# Patient Record
Sex: Female | Born: 1975 | Race: Black or African American | Hispanic: No | Smoking: Never smoker
Health system: Southern US, Community
[De-identification: ages and names within clinical notes are randomized; demographics above are authoritative.]

## PROBLEM LIST (undated history)

## (undated) DIAGNOSIS — F79 Unspecified intellectual disabilities: Secondary | ICD-10-CM

## (undated) DIAGNOSIS — E119 Type 2 diabetes mellitus without complications: Secondary | ICD-10-CM

## (undated) DIAGNOSIS — I10 Essential (primary) hypertension: Secondary | ICD-10-CM

## (undated) DIAGNOSIS — F259 Schizoaffective disorder, unspecified: Secondary | ICD-10-CM

## (undated) HISTORY — DX: Essential (primary) hypertension: I10

---

## 1998-02-16 ENCOUNTER — Encounter: Admission: RE | Admit: 1998-02-16 | Discharge: 1998-02-16 | Payer: Self-pay | Admitting: *Deleted

## 1998-02-16 ENCOUNTER — Encounter: Admission: RE | Admit: 1998-02-16 | Discharge: 1998-05-17 | Payer: Self-pay | Admitting: Internal Medicine

## 1999-05-28 ENCOUNTER — Encounter: Payer: Self-pay | Admitting: *Deleted

## 1999-05-28 ENCOUNTER — Inpatient Hospital Stay (HOSPITAL_COMMUNITY): Admission: AD | Admit: 1999-05-28 | Discharge: 1999-05-28 | Payer: Self-pay | Admitting: *Deleted

## 1999-06-09 ENCOUNTER — Emergency Department (HOSPITAL_COMMUNITY): Admission: EM | Admit: 1999-06-09 | Discharge: 1999-06-09 | Payer: Self-pay | Admitting: *Deleted

## 1999-07-17 ENCOUNTER — Inpatient Hospital Stay (HOSPITAL_COMMUNITY): Admission: AD | Admit: 1999-07-17 | Discharge: 1999-07-17 | Payer: Self-pay | Admitting: *Deleted

## 1999-08-26 ENCOUNTER — Encounter: Payer: Self-pay | Admitting: *Deleted

## 1999-08-26 ENCOUNTER — Ambulatory Visit (HOSPITAL_COMMUNITY): Admission: RE | Admit: 1999-08-26 | Discharge: 1999-08-26 | Payer: Self-pay | Admitting: *Deleted

## 1999-10-28 ENCOUNTER — Ambulatory Visit (HOSPITAL_COMMUNITY): Admission: RE | Admit: 1999-10-28 | Discharge: 1999-10-28 | Payer: Self-pay | Admitting: Obstetrics and Gynecology

## 1999-10-28 ENCOUNTER — Encounter: Payer: Self-pay | Admitting: Obstetrics and Gynecology

## 2000-01-19 ENCOUNTER — Inpatient Hospital Stay (HOSPITAL_COMMUNITY): Admission: AD | Admit: 2000-01-19 | Discharge: 2000-01-21 | Payer: Self-pay | Admitting: Obstetrics and Gynecology

## 2000-01-22 ENCOUNTER — Encounter: Admission: RE | Admit: 2000-01-22 | Discharge: 2000-02-14 | Payer: Self-pay | Admitting: Obstetrics and Gynecology

## 2000-04-15 ENCOUNTER — Other Ambulatory Visit: Admission: RE | Admit: 2000-04-15 | Discharge: 2000-04-15 | Payer: Self-pay | Admitting: Obstetrics and Gynecology

## 2000-04-15 ENCOUNTER — Encounter (INDEPENDENT_AMBULATORY_CARE_PROVIDER_SITE_OTHER): Payer: Self-pay

## 2000-09-24 ENCOUNTER — Other Ambulatory Visit: Admission: RE | Admit: 2000-09-24 | Discharge: 2000-09-24 | Payer: Self-pay | Admitting: Obstetrics and Gynecology

## 2001-03-17 ENCOUNTER — Emergency Department (HOSPITAL_COMMUNITY): Admission: EM | Admit: 2001-03-17 | Discharge: 2001-03-17 | Payer: Self-pay | Admitting: Emergency Medicine

## 2002-02-08 ENCOUNTER — Other Ambulatory Visit: Admission: RE | Admit: 2002-02-08 | Discharge: 2002-02-08 | Payer: Self-pay | Admitting: *Deleted

## 2002-09-05 ENCOUNTER — Inpatient Hospital Stay (HOSPITAL_COMMUNITY): Admission: AD | Admit: 2002-09-05 | Discharge: 2002-09-07 | Payer: Self-pay | Admitting: *Deleted

## 2002-10-18 ENCOUNTER — Other Ambulatory Visit: Admission: RE | Admit: 2002-10-18 | Discharge: 2002-10-18 | Payer: Self-pay | Admitting: *Deleted

## 2003-11-06 ENCOUNTER — Encounter: Admission: RE | Admit: 2003-11-06 | Discharge: 2003-11-06 | Payer: Self-pay | Admitting: Specialist

## 2005-12-26 ENCOUNTER — Other Ambulatory Visit: Admission: RE | Admit: 2005-12-26 | Discharge: 2005-12-26 | Payer: Self-pay | Admitting: Obstetrics and Gynecology

## 2006-11-22 ENCOUNTER — Emergency Department (HOSPITAL_COMMUNITY): Admission: EM | Admit: 2006-11-22 | Discharge: 2006-11-22 | Payer: Self-pay | Admitting: Emergency Medicine

## 2008-11-24 ENCOUNTER — Inpatient Hospital Stay (HOSPITAL_COMMUNITY): Admission: AD | Admit: 2008-11-24 | Discharge: 2008-11-28 | Payer: Self-pay | Admitting: Obstetrics & Gynecology

## 2009-06-09 ENCOUNTER — Inpatient Hospital Stay (HOSPITAL_COMMUNITY): Admission: AD | Admit: 2009-06-09 | Discharge: 2009-06-10 | Payer: Self-pay | Admitting: Obstetrics and Gynecology

## 2009-07-30 ENCOUNTER — Inpatient Hospital Stay (HOSPITAL_COMMUNITY): Admission: AD | Admit: 2009-07-30 | Discharge: 2009-07-30 | Payer: Self-pay | Admitting: Obstetrics & Gynecology

## 2009-08-06 ENCOUNTER — Inpatient Hospital Stay (HOSPITAL_COMMUNITY): Admission: AD | Admit: 2009-08-06 | Discharge: 2009-08-08 | Payer: Self-pay | Admitting: Obstetrics and Gynecology

## 2010-08-26 ENCOUNTER — Emergency Department (HOSPITAL_COMMUNITY)
Admission: EM | Admit: 2010-08-26 | Discharge: 2010-08-27 | Payer: Self-pay | Source: Home / Self Care | Admitting: Emergency Medicine

## 2010-08-27 ENCOUNTER — Inpatient Hospital Stay (HOSPITAL_COMMUNITY)
Admission: AD | Admit: 2010-08-27 | Discharge: 2010-09-02 | Payer: Self-pay | Source: Home / Self Care | Attending: Psychiatry | Admitting: Psychiatry

## 2010-08-27 DIAGNOSIS — F259 Schizoaffective disorder, unspecified: Secondary | ICD-10-CM

## 2010-08-27 LAB — DIFFERENTIAL
Basophils Absolute: 0 10*3/uL (ref 0.0–0.1)
Basophils Relative: 0 % (ref 0–1)
Eosinophils Absolute: 0.1 10*3/uL (ref 0.0–0.7)
Eosinophils Relative: 2 % (ref 0–5)
Lymphocytes Relative: 53 % — ABNORMAL HIGH (ref 12–46)
Lymphs Abs: 3.2 10*3/uL (ref 0.7–4.0)
Monocytes Absolute: 0.3 10*3/uL (ref 0.1–1.0)
Monocytes Relative: 5 % (ref 3–12)
Neutro Abs: 2.4 10*3/uL (ref 1.7–7.7)
Neutrophils Relative %: 40 % — ABNORMAL LOW (ref 43–77)

## 2010-08-27 LAB — URINALYSIS, ROUTINE W REFLEX MICROSCOPIC
Bilirubin Urine: NEGATIVE
Ketones, ur: NEGATIVE mg/dL
Leukocytes, UA: NEGATIVE
Nitrite: NEGATIVE
Specific Gravity, Urine: 1.005 (ref 1.005–1.030)
Urine Glucose, Fasting: NEGATIVE mg/dL
pH: 6.5 (ref 5.0–8.0)

## 2010-08-27 LAB — URINE MICROSCOPIC-ADD ON

## 2010-08-27 LAB — CBC
HCT: 38.5 % (ref 36.0–46.0)
Hemoglobin: 13.2 g/dL (ref 12.0–15.0)
MCH: 30.8 pg (ref 26.0–34.0)
MCHC: 34.3 g/dL (ref 30.0–36.0)
MCV: 89.7 fL (ref 78.0–100.0)
Platelets: 219 10*3/uL (ref 150–400)
RBC: 4.29 MIL/uL (ref 3.87–5.11)
RDW: 12.1 % (ref 11.5–15.5)
WBC: 6 10*3/uL (ref 4.0–10.5)

## 2010-08-27 LAB — BASIC METABOLIC PANEL
CO2: 25 mEq/L (ref 19–32)
Calcium: 8.9 mg/dL (ref 8.4–10.5)
Chloride: 107 mEq/L (ref 96–112)
Creatinine, Ser: 0.72 mg/dL (ref 0.4–1.2)
GFR calc non Af Amer: >60 mL/min (ref >60)
Potassium: 3.7 mEq/L (ref 3.5–5.1)

## 2010-08-27 LAB — RAPID URINE DRUG SCREEN, HOSP PERFORMED
Cocaine: NOT DETECTED
Opiates: NOT DETECTED

## 2010-08-27 LAB — POCT PREGNANCY, URINE: Preg Test, Ur: NEGATIVE

## 2010-08-27 LAB — ETHANOL: Alcohol, Ethyl (B): <5 mg/dL (ref 0–10)

## 2010-09-09 NOTE — H&P (Signed)
  NAMEELVIN, BANKER                  ACCOUNT NO.:  [id number]  MEDICAL RECORD NO.:  [id number]          PATIENT TYPE:  IPS  LOCATION:  0400                          FACILITY:  [facility]  PHYSICIAN:  Anselm Jungling, MD  DATE OF BIRTH:  06/29/1976  DATE OF ADMISSION:  08/27/2010 DATE OF DISCHARGE:                      PSYCHIATRIC ADMISSION ASSESSMENT   This is a 35 year old female, voluntarily admitted August 27, 2010. The patient presented to the emergency department with complaints of suicidal thoughts, brought in by her crisis counselor.  The patient has been hearing voices, telling her to say and do things that are harmful to herself and others.  She feels very stressed and anxious.  She states her children are currently in social service custody and she states that the voices were telling her to drive around with them for some period of time.  She has been experiencing increased agitation, dizziness, racing thoughts, muscle pain.  The patient denies any substance use.  PAST PSYCHIATRIC HISTORY:  First admission to the [facility].  Her psychiatric treatment is with [facility].  SOCIAL HISTORY:  The patient resides in [place].  She lives with her parents and a 35-year-old daughter, born in [place].  FAMILY HISTORY:  None.  ALCOHOL/DRUG HISTORY:  She denies any substance use.  PRIMARY CARE PROVIDER:  None.  MEDICAL PROBLEMS:  History of migraines.  MEDICATIONS:  None.  DRUG ALLERGIES:  No known allergies.  PHYSICAL EXAM:  This is a well-nourished, well-developed female that was assessed in the emergency department.  Her laboratory data shows a CBC within normal.  Serum pregnancy test is negative.  Alcohol level less than 5.  Her BMET is within normal limits.  Urine drug screen is negative.  MENTAL STATUS EXAM:  The patient is in bed.  She is complaining of anxiety, having some twitching to her neck.  The patient feels that it is from stress and anxiety.   Her speech is clear.  She has intermittent eye contact.  She is, however, overall pleasant and cooperative, acknowledges need for treatment.  AXIS I:  Psychosis, not otherwise specified.  Depressive disorder, not otherwise specified. AXIS II:  Deferred. AXIS III:  No known medical conditions. AXIS IV:  Problems with legal system and possible other psychosocial problems. AXIS V:  Current is 30.  PLAN:  Our plan is to initiate Risperdal twice a day for psychotic symptoms and anxiety.  We will have Ativan available on a p.r.n. basis. Continue to do reality testing.  Contact family for concerned support, maybe change her living situation.  Her tentative length stay at this time is 3-5 days.     Landry Corporal, N.P.   ______________________________ Anselm Jungling, MD    JO/MEDQ  D:  08/28/2010  T:  08/28/2010  Job:  [id number]  Electronically Signed by Limmie PatriciaP. on 09/02/2010 03:52:12 PM Electronically Signed by Geralyn Flash MD on 09/09/2010 10:52:30 AM

## 2010-09-09 NOTE — Discharge Summary (Signed)
Tracy Vaughn, KOHEN                  ACCOUNT NO.:  [id number]  MEDICAL RECORD NO.:  [id number]          PATIENT TYPE:  IPS  LOCATION:  0400                          FACILITY:  [facility]  PHYSICIAN:  Anselm Jungling, MD  DATE OF BIRTH:  [birth year]  DATE OF ADMISSION:  08/27/2010 DATE OF DISCHARGE:  09/02/2010                              DISCHARGE SUMMARY   IDENTIFYING DATA AND REASON FOR ADMISSION:  This was an inpatient psychiatric admission for Hadja, a 35 year old African woman, who was admitted because of depression and symptoms of psychosis.  Please refer to the admission note for further details pertaining to the symptoms, circumstances, and history that led to her hospitalization.  She was given an initial Axis I diagnosis of schizoaffective disorder, NOS.  MEDICAL AND LABORATORY:  The patient was medically and physically assessed by the psychiatric nurse practitioner.  She was in good health, without any active or chronic medical problems.  There were no significant medical issues during her hospital stay.  HOSPITAL COURSE:  The patient was admitted to the adult inpatient psychiatric service.  She presented as a tall, normally-developed, well- groomed and dressed African woman who was quite articulate in [place], even though English was not her original language.  She was a good historian.  Her insight was intact.  She was able to acknowledge her depression and auditory hallucinations.  She did not appear to have any overt delusional beliefs, but apparently, in a previous episode of illness in 2010, she had had both auditory hallucinations and perceptual experiences involving a sense of strong spiritualization.  She indicated that her fiance is currently living in [place].  She had some other family here.  She was open to medication treatment to address her symptoms and was placed on a trial of risperidone which was increased to 1 mg b.i.d. With this, the patient reported  good resolution of her auditory hallucinations over several days.  She participated in therapeutic groups and activities and was a good participant throughout.  She was generally pleasant and cooperative.  Although she was not depressed per se, she was sad at times, but generally appeared quite comfortable being in this treatment situation and getting help.  "I have to get help to feel better."  Later during her stay, low-dose Klonopin 0.25 mg b.i.d. or t.i.d. was added to her regimen to address some symptoms of  anxiety.  On the seventh hospital day, the patient indicated that she was feeling much better with respect to mood and absence of auditory hallucinations. She indicated that she felt ready for discharge.  She indicated a strong willingness to continue with medication in the outpatient treatment arena.  She agreed to the following aftercare plan:  AFTERCARE:  The patient was to follow up with the [facility], appointment to be arranged at the time of this dictation.  DISCHARGE MEDICATIONS:  Risperdal 1 mg b.i.d.  The patient was not discharged on Klonopin.  DISCHARGE DIAGNOSES:  AXIS I:  Schizoaffective disorder, not otherwise specified. AXIS II:  Deferred. AXIS III:  No acute or chronic illnesses. AXIS IV:  Stressors moderate.  AXIS V:  Global assessment of functioning on discharge 50.     Anselm Jungling, MD     SPB/MEDQ  D:  09/02/2010  T:  09/02/2010  Job:  [id number]  Electronically Signed by Geralyn Flash MD on 09/09/2010 10:52:27 AM

## 2010-10-20 LAB — CBC
HCT: 35.8 % — ABNORMAL LOW (ref 36.0–46.0)
HCT: 38.7 % (ref 36.0–46.0)
MCHC: 33.2 g/dL (ref 30.0–36.0)
MCV: 92.3 fL (ref 78.0–100.0)
Platelets: 190 10*3/uL (ref 150–400)
Platelets: 202 10*3/uL (ref 150–400)
RBC: 3.87 MIL/uL (ref 3.87–5.11)
RDW: 14.6 % (ref 11.5–15.5)
WBC: 10.1 10*3/uL (ref 4.0–10.5)
WBC: 15.6 10*3/uL — ABNORMAL HIGH (ref 4.0–10.5)

## 2010-10-20 LAB — RPR: RPR Ser Ql: NONREACTIVE

## 2010-10-20 LAB — SICKLE CELL SCREEN: Sickle Cell Screen: NEGATIVE

## 2010-11-06 LAB — URINALYSIS, ROUTINE W REFLEX MICROSCOPIC
Bilirubin Urine: NEGATIVE
Glucose, UA: NEGATIVE mg/dL
Ketones, ur: NEGATIVE mg/dL
Protein, ur: NEGATIVE mg/dL
pH: 5 (ref 5.0–8.0)

## 2010-11-06 LAB — WET PREP, GENITAL
Clue Cells Wet Prep HPF POC: NONE SEEN
Trich, Wet Prep: NONE SEEN

## 2010-11-06 LAB — STREP B DNA PROBE: Strep Group B Ag: NEGATIVE

## 2010-11-06 LAB — URINE MICROSCOPIC-ADD ON

## 2010-11-08 ENCOUNTER — Emergency Department (HOSPITAL_COMMUNITY)
Admission: EM | Admit: 2010-11-08 | Discharge: 2010-11-08 | Disposition: A | Discharge status: Home / Self Care | Payer: Medicaid Other | Attending: Emergency Medicine | Admitting: Emergency Medicine

## 2010-11-08 DIAGNOSIS — G2589 Other specified extrapyramidal and movement disorders: Secondary | ICD-10-CM | POA: Insufficient documentation

## 2010-11-08 DIAGNOSIS — I10 Essential (primary) hypertension: Secondary | ICD-10-CM | POA: Insufficient documentation

## 2010-11-08 DIAGNOSIS — F319 Bipolar disorder, unspecified: Secondary | ICD-10-CM | POA: Insufficient documentation

## 2010-11-13 LAB — COMPREHENSIVE METABOLIC PANEL
ALT: 23 U/L (ref 0–35)
ALT: 40 U/L — ABNORMAL HIGH (ref 0–35)
AST: 26 U/L (ref 0–37)
AST: 29 U/L (ref 0–37)
Albumin: 2 g/dL — ABNORMAL LOW (ref 3.5–5.2)
Albumin: 2.1 g/dL — ABNORMAL LOW (ref 3.5–5.2)
Alkaline Phosphatase: 65 U/L (ref 39–117)
Alkaline Phosphatase: 67 U/L (ref 39–117)
BUN: 3 mg/dL — ABNORMAL LOW (ref 6–23)
BUN: 5 mg/dL — ABNORMAL LOW (ref 6–23)
BUN: 5 mg/dL — ABNORMAL LOW (ref 6–23)
CO2: 24 mEq/L (ref 19–32)
CO2: 26 mEq/L (ref 19–32)
Calcium: 8.3 mg/dL — ABNORMAL LOW (ref 8.4–10.5)
Calcium: 8.4 mg/dL (ref 8.4–10.5)
Chloride: 104 mEq/L (ref 96–112)
Chloride: 106 mEq/L (ref 96–112)
Chloride: 107 mEq/L (ref 96–112)
Creatinine, Ser: 0.59 mg/dL (ref 0.4–1.2)
Creatinine, Ser: 0.6 mg/dL (ref 0.4–1.2)
Creatinine, Ser: 0.62 mg/dL (ref 0.4–1.2)
GFR calc Af Amer: >60 mL/min (ref >60)
GFR calc Af Amer: >60 mL/min (ref >60)
GFR calc non Af Amer: >60 mL/min (ref >60)
GFR calc non Af Amer: >60 mL/min (ref >60)
Glucose, Bld: 107 mg/dL — ABNORMAL HIGH (ref 70–99)
Glucose, Bld: 69 mg/dL — ABNORMAL LOW (ref 70–99)
Potassium: 3.8 mEq/L (ref 3.5–5.1)
Potassium: 3.8 mEq/L (ref 3.5–5.1)
Sodium: 128 mEq/L — ABNORMAL LOW (ref 135–145)
Sodium: 134 mEq/L — ABNORMAL LOW (ref 135–145)
Total Bilirubin: 0.6 mg/dL (ref 0.3–1.2)
Total Bilirubin: 0.7 mg/dL (ref 0.3–1.2)
Total Protein: 6.4 g/dL (ref 6.0–8.3)
Total Protein: 6.6 g/dL (ref 6.0–8.3)
Total Protein: 6.6 g/dL (ref 6.0–8.3)

## 2010-11-13 LAB — DIFFERENTIAL
Basophils Absolute: 0 10*3/uL (ref 0.0–0.1)
Basophils Absolute: 0 10*3/uL (ref 0.0–0.1)
Basophils Relative: 0 % (ref 0–1)
Eosinophils Absolute: 0 10*3/uL (ref 0.0–0.7)
Eosinophils Relative: 1 % (ref 0–5)
Eosinophils Relative: 1 % (ref 0–5)
Lymphocytes Relative: 33 % (ref 12–46)
Lymphocytes Relative: 36 % (ref 12–46)
Lymphocytes Relative: 41 % (ref 12–46)
Lymphs Abs: 0.6 10*3/uL — ABNORMAL LOW (ref 0.7–4.0)
Lymphs Abs: 1.3 10*3/uL (ref 0.7–4.0)
Lymphs Abs: 1.9 10*3/uL (ref 0.7–4.0)
Monocytes Absolute: 0.5 10*3/uL (ref 0.1–1.0)
Monocytes Absolute: 0.6 10*3/uL (ref 0.1–1.0)
Monocytes Absolute: 0.8 10*3/uL (ref 0.1–1.0)
Monocytes Relative: 17 % — ABNORMAL HIGH (ref 3–12)
Monocytes Relative: 18 % — ABNORMAL HIGH (ref 3–12)
Neutro Abs: 1.7 10*3/uL (ref 1.7–7.7)
Neutro Abs: 1.9 10*3/uL (ref 1.7–7.7)
Neutro Abs: 1.9 10*3/uL (ref 1.7–7.7)
Neutrophils Relative %: 80 % — ABNORMAL HIGH (ref 43–77)

## 2010-11-13 LAB — TYPE AND SCREEN: Antibody Screen: NEGATIVE

## 2010-11-13 LAB — CBC
HCT: 22.3 % — ABNORMAL LOW (ref 36.0–46.0)
HCT: 22.5 % — ABNORMAL LOW (ref 36.0–46.0)
HCT: 25 % — ABNORMAL LOW (ref 36.0–46.0)
HCT: 27.7 % — ABNORMAL LOW (ref 36.0–46.0)
HCT: 31.3 % — ABNORMAL LOW (ref 36.0–46.0)
Hemoglobin: 10.8 g/dL — ABNORMAL LOW (ref 12.0–15.0)
Hemoglobin: 7.9 g/dL — CL (ref 12.0–15.0)
MCHC: 34.6 g/dL (ref 30.0–36.0)
MCHC: 35.1 g/dL (ref 30.0–36.0)
MCV: 84.7 fL (ref 78.0–100.0)
MCV: 84.9 fL (ref 78.0–100.0)
MCV: 85 fL (ref 78.0–100.0)
Platelets: 129 10*3/uL — ABNORMAL LOW (ref 150–400)
Platelets: 75 10*3/uL — ABNORMAL LOW (ref 150–400)
Platelets: 78 10*3/uL — ABNORMAL LOW (ref 150–400)
RBC: 2.65 MIL/uL — ABNORMAL LOW (ref 3.87–5.11)
RBC: 2.66 MIL/uL — ABNORMAL LOW (ref 3.87–5.11)
RBC: 3.69 MIL/uL — ABNORMAL LOW (ref 3.87–5.11)
RDW: 15 % (ref 11.5–15.5)
RDW: 15.4 % (ref 11.5–15.5)
WBC: 3.6 10*3/uL — ABNORMAL LOW (ref 4.0–10.5)
WBC: 3.8 10*3/uL — ABNORMAL LOW (ref 4.0–10.5)
WBC: 4.7 10*3/uL (ref 4.0–10.5)
WBC: 5.4 10*3/uL (ref 4.0–10.5)

## 2010-11-13 LAB — CULTURE, BLOOD (ROUTINE X 2)

## 2010-11-13 LAB — URINE CULTURE: Colony Count: >=100000

## 2010-11-13 LAB — GLUCOSE, CAPILLARY
Glucose-Capillary: 119 mg/dL — ABNORMAL HIGH (ref 70–99)
Glucose-Capillary: 65 mg/dL — ABNORMAL LOW (ref 70–99)
Glucose-Capillary: 82 mg/dL (ref 70–99)
Glucose-Capillary: 86 mg/dL (ref 70–99)

## 2010-11-13 LAB — HIV ANTIBODY (ROUTINE TESTING W REFLEX): HIV: NONREACTIVE

## 2010-11-13 LAB — URINALYSIS, ROUTINE W REFLEX MICROSCOPIC
Nitrite: NEGATIVE
Specific Gravity, Urine: 1.015 (ref 1.005–1.030)
pH: 6 (ref 5.0–8.0)

## 2010-11-13 LAB — HCG, QUANTITATIVE, PREGNANCY: hCG, Beta Chain, Quant, S: 4135 m[IU]/mL — ABNORMAL HIGH (ref <5)

## 2010-11-13 LAB — URINE MICROSCOPIC-ADD ON

## 2010-11-13 LAB — WET PREP, GENITAL

## 2010-11-13 LAB — RPR: RPR Ser Ql: NONREACTIVE

## 2010-11-13 LAB — MALARIA SMEAR

## 2010-12-12 ENCOUNTER — Emergency Department (HOSPITAL_COMMUNITY)
Admission: EM | Admit: 2010-12-12 | Discharge: 2010-12-12 | Disposition: A | Discharge status: Home / Self Care | Payer: Medicaid Other | Attending: Emergency Medicine | Admitting: Emergency Medicine

## 2010-12-12 DIAGNOSIS — M62838 Other muscle spasm: Secondary | ICD-10-CM | POA: Insufficient documentation

## 2010-12-12 DIAGNOSIS — R51 Headache: Secondary | ICD-10-CM | POA: Insufficient documentation

## 2010-12-12 DIAGNOSIS — R259 Unspecified abnormal involuntary movements: Secondary | ICD-10-CM | POA: Insufficient documentation

## 2010-12-17 NOTE — H&P (Signed)
Tracy Vaughn, Tracy Vaughn                  ACCOUNT NO.:  [id number]   MEDICAL RECORD NO.:  [id number]          PATIENT TYPE:  OBV   LOCATION:  9308                          [facility]ILITY:  [facility]   PHYSICIAN:  Hal Morales, M.D.DATE OF BIRTH:  11-29-1975   DATE OF ADMISSION:  11/24/2008  DATE OF DISCHARGE:                              HISTORY & PHYSICAL   HISTORY OF PRESENT ILLNESS:  The patient is a 35 year old black female  para 2-0-0-2 who presents with chief complaint of a worsening migraine  over the last few days, nausea, vomiting has accompanied the headache  and intermittent vaginal bleeding since November 20, 2008 which she had  thought may be an early menses.  She reports recent travel to [place].  Her trip was from February 14 and she returned to [place]  April 12.  She traveled alone with her brother.  She does have two older  children, a 39-year-old daughter as well as a 60-year-old son who did not  accompany them on the trip and are presently here with their father.  She reported last solids that she had kept down were April 22 around  8:30 p.m.  She reports feeling feverish but did not take her temperature  at home.  Her last office visit at [facility] was this past  February when she had her Implanon discontinued by Dr. Dois Davenport A. Rivard.  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  The patient  reports prior to April 19 onset of bleeding and a last normal menstrual  period of October 27, 2008.  She does report that the vaginal bleeding  that she has noted since the 19th has been intermittent and has also  been lighter and has been varying degrees of red and brown.  The patient  is currently unemployed.  She reports she does not have  any insurance.  She is not in a relationship with her older children's father.  She does  live with her children.  Her brother reports both of them received  yellow fever vaccine and p.o. antibiotics before leaving for their  trip to the [facility].  She denies any significant past medical  history with the exception of the migraines.  She denies abnormal Pap  smears or STD treatment in the past.  She has had two term vaginal  deliveries per reports, denies any surgical history.  She has no known  drug allergies.  Her prior deliveries have not been with [facility].  She reports other vaccines are up-to-date.   OBJECTIVE:  VITAL SIGNS:  On admission her temperature was 101.8 orally,  blood pressure 105/67, heart rate was 144 and respirations were 20.  Her  pulse ox was 94% on room air, TMAX was 104 after noting that she was  given a gram of p.o.  Tylenol x1 at 1821 and also was given an IV bolus  and has continued on IV fluid since then.  Her most recent vital signs  at 2150 p.m. tonight temperature was 98.4, heart rate was 98, blood  pressure of 101/63, SAO2 was 97% on room air and her respirations were  18.  She did receive 25 mg of Phenergan in her first bag of IV fluid.  She does have gonorrhea and chlamydia cultures which are pending.  Her  wet prep showed few yeast and few white blood cells otherwise was within  normal limits.  Urinalysis showed orange color, cloudy, specific gravity  was within normal limits equal to 1.015 was spilling 100 mg of glucose,  had moderate hemoglobin, moderate bilirubin, 15 of ketones, 30 of  protein, greater than 8 urobilinogen and small leukocyte esterase.  Micro urinalysis showed many epithelial cells as well as many bacteria.  She had a quantitative HCG drawn after a positive UPT and it was 4135.  Her CBC showed a white count that was within normal limits equal to 4.6,  hemoglobin was low equal to 10.8.  Her hematocrit was low equal to 31.3.   Her platelets were low equal to 91.  Differential was within normal  limits except absolute lymphocytes were slightly low equal to 0.6.  She  has a malaria smear that is pending.  She did have a complete metabolic  panel which showed sodium low equal to 128, potassium was low equal to  3.4.  Otherwise it was within normal limits.  Just to note SGOT was 29,  SGPT was 23 but were within normal limits.  Following the quantitative  HCG the patient was sent for an OB ultrasound.  The impression was  consistent with showing two small intrauterine fluid collections which  were most consistent with two intrauterine gestational sacs, mean sac  diameter of gestational sac A was 6.5 mm which was consistent with 5-  week 2 days and mean sac diameter of gestational sac B was 5.2 mm which  was 5 weeks even.  There were no embryos nor yolk sacs but is consistent  with a 5-week gestation and no subchorionic hemorrhage was noted.  Right  ovary was measuring 3.5 x 2.5 x 2.6 cm and did contain a probable corpus  luteal cyst, left ovary was measuring 4.2 x 2 x 2 cm and also contained  a probable corpus luteal cyst.  There was a small amount of free pelvic  fluid identified and the radiologist recommended follow-up in 10-14 days  to confirm suspected twin gestation.   PHYSICAL EXAM:  GENERAL:  She is alert and oriented but is very sleepy.  She is in no acute distress.  She has no rebound no guarding, but does  have general malaise and weakness.  Her skin is warm and was diaphoretic  on my examination.  HEENT:  he was within normal limits and grossly intact.  CARDIOVASCULAR:  Regular rate and rhythm without murmur.  LUNGS:  Clear to auscultation bilaterally.  ABDOMEN:  Soft, nontender.  PELVIC:  Sterile spec examination; she did have moderate K-Y Lube both  externally and in internal vault status post her transvaginal  ultrasound.  She did have scant speckled brown and white discharge  within the vault.   Rugae were within normal limits.  Her cervix was pink  and smooth.  Cervix was closed and long.  On bimanual exam no cervical  motion tenderness.  Uterus size was slightly enlarged  and her  extremities are within normal limits.   IMPRESSION:  1. Early pregnancy with suspected twin gestation which are measuring      around [redacted] weeks gestation per gestational sacs.  2. Fever, which has returned to normal status post Tylenol 1 gram as      well as IV fluids.  3. Nausea, vomiting, unspecified whether related to early pregnancy      versus a viral syndrome or infection.  4. Vaginal bleeding since April 19, suspicion of implantation      bleeding.  5. Recent return from [facility], November 13, 2008.  6. History of migraines (episode last few days which has worsened with      time).  7. Yeast vaginitis.  8. Anemia.  9. Thrombocytopenia.  10.Normal white blood cell count.  11.Suspected urinary tract infection with a culture and sensitivity      pending.  12.Abnormal chemistries with probable relationship to nausea,      vomiting.  13.In light of recent travel, malaria smear is pending.   PLAN:  Consulted with Dr. Pennie Rushing regarding the patient's status as well  as findings and plan is to admit the patient to third floor for 23-hour  observation where she will continue IV hydration.  She is to receive  Ancef 2 grams IV x1.  She is to continue antiemetics p.r.n. as well as  advancing diet as tolerated and she will have a repeat CBC with diff and  CMET in the morning.      Candice Belgrade, [place]      Hal Morales, M.D.  Electronically Signed    CHS/MEDQ  D:  11/24/2008  T:  11/25/2008  Job:  [id number]
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
## 2010-12-20 NOTE — Discharge Summary (Signed)
NAMEODELL, FASCHING                  ACCOUNT NO.:  [id number]   MEDICAL RECORD NO.:  [id number]          PATIENT TYPE:  INP   LOCATION:  9308                          FACILITY:  [facility]   PHYSICIAN:  Crist Fat. Rivard, M.D. DATE OF BIRTH:  06/20/1976   DATE OF ADMISSION:  11/24/2008  DATE OF DISCHARGE:  11/28/2008                               DISCHARGE SUMMARY   Ms. Mclelland is a 35 year old gravida 3, para 2-0-0-2 who presented to the  Maternity Admission Unit with complaints of fever following a trip to  the [place].  She did have her Implanon removed per Dr.  Estanislado Pandy in February 2010.   ADMISSION DIAGNOSES:  Intrauterine pregnancy at approximately 5-week  gestation, twins; nausea; vomiting; fever; probable malaria;  tachycardia; vaginal bleeding; yeast infection; probable urinary tract  infection; anemia; thrombocytopenia; hyperglycemia.   DISCHARGE DIAGNOSES:  Abnormal beta quantitative hCG, questionable  pending, spontaneous abortion, twin gestation at 5 weeks, malaria with  ongoing treatment, afebrile, vaginal bleeding resolved, yeast infection  of urine, hypoglycemia secondary to malaria, and elevated SGPT.   LABORATORY FINDINGS:  Ms. Dockendorf is O+ and had negative hepatitis B,  hepatitis C, HIV and RPR tests.  Her urine grew positive yeast.  Her  chlamydia and gonorrhea cultures were negative.  Her wet prep showed  yeast.  Her quantitative beta-hCG on April 26 was 4532.  Her  quantitative beta-HCG on April 23 was 4135.  Her labs were as follows on  3 different dates; April 27, April 26, and April 25.  The labs for April  26 and April 25; WBC 5.4 (4.7, 3.9), HGB 7.7 ( 7.9, 8.8), HCT 22.5  (22.3, 25.0), PLT 129 (94, 75), SGOT 32 (40, 29), SGPT 39 (48, 26),  glucose 93 (69, 90).   Procedures; Ms. Muilenburg had IV Ancef and then quinine and IV clindamycin for  3 days.  She had IV hydration.  She had blood cultures x2 that were  negative.  She had ultrasound that showed 2 gestational  sacs, one  measuring 5 weeks 0 days, one measuring 5 weeks 2 days.  No yolk sacs  are seen, no fetal poles are seen, no subchorionic hemorrhage is seen.  The right ovary was noted to have an apparent corpus luteum cyst  measuring 3.5 x 2.5 x 2.6 cm.  The left ovary was noted to also have an  apparent corpus luteum cyst measuring 4.2 x 2 x 2 cm.  Ms. Fahmy maximum  temperature was 102.9 on April 24.  She was afebrile for more than 24  hours upon the time of her discharge.  She did have positive malaria  smear.  On day of discharge, she was dramatically improved with her  affect and skin color and energy, and had a normal physical assessment.  Her vaginal bleeding had resolved.  Her lungs were clear to  auscultation.  Her heart had a regular rate and rhythm.  Her extremities  were without edema.  She had normal deep tendon reflexes and negative  Homans sign.  Her abdomen was soft and nontender.  Ms. Friedlander was discharged on pelvic rest.  She was not given Diflucan due  to concerns of her being a category C.  She was put on iron twice a day  and stool softeners twice a day.  She was to come into the office the  next day for a followup beta quant to check the pregnancy and went home  on bleeding precautions and pain precautions.      Janna Melsness, CNM      Sandra A. Rivard, M.D.  Electronically Signed    JM/MEDQ  D:  12/19/2008  T:  12/19/2008  Job:  [id number]

## 2012-11-06 ENCOUNTER — Emergency Department (HOSPITAL_COMMUNITY)
Admission: EM | Admit: 2012-11-06 | Discharge: 2012-11-06 | Disposition: A | Discharge status: Home / Self Care | Payer: Medicaid Other | Attending: Emergency Medicine | Admitting: Emergency Medicine

## 2012-11-06 ENCOUNTER — Encounter (HOSPITAL_COMMUNITY): Payer: Self-pay | Admitting: *Deleted

## 2012-11-06 DIAGNOSIS — Z3202 Encounter for pregnancy test, result negative: Secondary | ICD-10-CM | POA: Insufficient documentation

## 2012-11-06 DIAGNOSIS — F17200 Nicotine dependence, unspecified, uncomplicated: Secondary | ICD-10-CM | POA: Insufficient documentation

## 2012-11-06 DIAGNOSIS — N898 Other specified noninflammatory disorders of vagina: Secondary | ICD-10-CM | POA: Insufficient documentation

## 2012-11-06 DIAGNOSIS — Z79899 Other long term (current) drug therapy: Secondary | ICD-10-CM | POA: Insufficient documentation

## 2012-11-06 DIAGNOSIS — Z711 Person with feared health complaint in whom no diagnosis is made: Secondary | ICD-10-CM

## 2012-11-06 DIAGNOSIS — R112 Nausea with vomiting, unspecified: Secondary | ICD-10-CM | POA: Insufficient documentation

## 2012-11-06 DIAGNOSIS — K2970 Gastritis, unspecified, without bleeding: Secondary | ICD-10-CM | POA: Insufficient documentation

## 2012-11-06 DIAGNOSIS — F79 Unspecified intellectual disabilities: Secondary | ICD-10-CM | POA: Insufficient documentation

## 2012-11-06 HISTORY — DX: Unspecified intellectual disabilities: F79

## 2012-11-06 LAB — CBC WITH DIFFERENTIAL/PLATELET
Basophils Relative: 0 % (ref 0–1)
Eosinophils Absolute: 0.3 10*3/uL (ref 0.0–0.7)
HCT: 35.4 % — ABNORMAL LOW (ref 36.0–46.0)
Hemoglobin: 11.9 g/dL — ABNORMAL LOW (ref 12.0–15.0)
MCH: 27.9 pg (ref 26.0–34.0)
MCHC: 33.6 g/dL (ref 30.0–36.0)
Monocytes Absolute: 0.4 10*3/uL (ref 0.1–1.0)
Monocytes Relative: 4 % (ref 3–12)
Neutro Abs: 6.1 10*3/uL (ref 1.7–7.7)

## 2012-11-06 LAB — URINALYSIS, ROUTINE W REFLEX MICROSCOPIC
Glucose, UA: NEGATIVE mg/dL
Hgb urine dipstick: NEGATIVE
Ketones, ur: NEGATIVE mg/dL
Protein, ur: NEGATIVE mg/dL

## 2012-11-06 LAB — WET PREP, GENITAL: Trich, Wet Prep: NONE SEEN

## 2012-11-06 LAB — URINE MICROSCOPIC-ADD ON

## 2012-11-06 LAB — COMPREHENSIVE METABOLIC PANEL
Albumin: 3.4 g/dL — ABNORMAL LOW (ref 3.5–5.2)
BUN: 7 mg/dL (ref 6–23)
Chloride: 100 mEq/L (ref 96–112)
Creatinine, Ser: 0.85 mg/dL (ref 0.50–1.10)
GFR calc Af Amer: >90 mL/min (ref >90)
GFR calc non Af Amer: 87 mL/min — ABNORMAL LOW (ref >90)
Total Bilirubin: 0.2 mg/dL — ABNORMAL LOW (ref 0.3–1.2)

## 2012-11-06 LAB — LIPASE, BLOOD: Lipase: 21 U/L (ref 11–59)

## 2012-11-06 MED ORDER — CEFTRIAXONE SODIUM 250 MG IJ SOLR
250.0000 mg | Freq: Once | INTRAMUSCULAR | Status: AC
  Administered 2012-11-06: 250 mg via INTRAMUSCULAR
  Filled 2012-11-06: qty 250

## 2012-11-06 MED ORDER — AZITHROMYCIN 250 MG PO TABS
1000.0000 mg | ORAL_TABLET | Freq: Once | ORAL | Status: AC
  Administered 2012-11-06: 1000 mg via ORAL
  Filled 2012-11-06: qty 4

## 2012-11-06 MED ORDER — ONDANSETRON HCL 4 MG/2ML IJ SOLN
4.0000 mg | Freq: Once | INTRAMUSCULAR | Status: AC
  Administered 2012-11-06: 4 mg via INTRAVENOUS
  Filled 2012-11-06: qty 2

## 2012-11-06 MED ORDER — SODIUM CHLORIDE 0.9 % IV BOLUS (SEPSIS)
1000.0000 mL | Freq: Once | INTRAVENOUS | Status: AC
  Administered 2012-11-06: 1000 mL via INTRAVENOUS

## 2012-11-06 MED ORDER — ONDANSETRON 4 MG PO TBDP: ORAL_TABLET | ORAL | Status: DC

## 2012-11-06 NOTE — ED Notes (Signed)
Pelvic cart set up at bedside. Pt undress from waist down and has gown on.

## 2012-11-06 NOTE — ED Notes (Signed)
Pt sts the medicine she just started taking is Lithium and Fanapt for mental issues.

## 2012-11-06 NOTE — ED Provider Notes (Signed)
History     CSN: [id number]  Arrival date & time 11/06/12  1929   First MD Initiated Contact with Patient 11/06/12 1944      Chief Complaint  Patient presents with  . Abdominal Pain    (Consider location/radiation/quality/duration/timing/severity/associated sxs/prior treatment) Patient is a 37 y.o. female presenting with abdominal pain. The history is provided by the patient and medical records. No language interpreter was used.  Abdominal Pain Pain location:  Epigastric Associated symptoms: nausea, vaginal discharge and vomiting   Associated symptoms: no chest pain, no constipation, no cough, no diarrhea, no dysuria, no fatigue, no fever, no hematuria and no shortness of breath     Tracy Vaughn is a 37 y.o. female  with a hx of mental impairment presents to the Emergency Department complaining of gradual, persistent, progressively worsening epigastric abdominal pain again yesterday evening. Associated symptoms include nausea and vomiting.  Nothing makes it better and  nothing makes it worse.  Pt denies fever, chills, headache, chest pain, shortness of breath, diarrhea, weakness, dizziness, syncope, dysuria. Patient states she has been taking lithium and Cogentin for her mental disorder. She states last night after she took her medications she began to vomit and then had associated epigastric pain. She describes the pain as soreness, rated at a 4/10, non-radiating.  Patient also complains of one-month of malodorous, thick white vaginal discharge. She denies being sexually active. LMP was last month.     Past Medical History  Diagnosis Date  . Mental impairment     No past surgical history on file.  No family history on file.  History  Substance Use Topics  . Smoking status: Current Every Day Smoker  . Smokeless tobacco: Not on file  . Alcohol Use: No    OB History   Grav Para Term Preterm Abortions TAB SAB Ect Mult Living                  Review of Systems   Constitutional: Negative for fever, diaphoresis, appetite change, fatigue and unexpected weight change.  HENT: Negative for mouth sores, trouble swallowing, neck pain and neck stiffness.   Respiratory: Negative for cough, chest tightness, shortness of breath, wheezing and stridor.   Cardiovascular: Negative for chest pain and palpitations.  Gastrointestinal: Positive for nausea, vomiting and abdominal pain. Negative for diarrhea, constipation, blood in stool, abdominal distention and rectal pain.  Genitourinary: Positive for vaginal discharge. Negative for dysuria, urgency, frequency, hematuria, flank pain and difficulty urinating.  Musculoskeletal: Negative for back pain.  Skin: Negative for rash.  Neurological: Negative for weakness.  Hematological: Negative for adenopathy.  Psychiatric/Behavioral: Negative for confusion.    Allergies  Fanapt and Lithium  Home Medications   Current Outpatient Rx  Name  Route  Sig  Dispense  Refill  . benztropine (COGENTIN) 2 MG tablet   Oral   Take 2 mg by mouth 2 (two) times daily.         . Iloperidone (FANAPT) 6 MG TABS   Oral   Take 1 tablet by mouth daily.         Marland Kitchen lithium carbonate 300 MG capsule   Oral   Take 300 mg by mouth 2 (two) times daily with a meal.         . ondansetron (ZOFRAN ODT) 4 MG disintegrating tablet      4mg  ODT q4 hours prn nausea/vomit   4 tablet   0     BP 124/75  Pulse 76  Temp(Src) 98.5  F (36.9 C) (Oral)  Resp 22  SpO2 100%  LMP 10/06/2012  Physical Exam  Nursing note and vitals reviewed. Constitutional: She is oriented to person, place, and time. She appears well-developed and well-nourished.  HENT:  Head: Normocephalic and atraumatic.  Mouth/Throat: Oropharynx is clear and moist.  Eyes: Conjunctivae and EOM are normal. Pupils are equal, round, and reactive to light. No scleral icterus.  Neck: Normal range of motion.  Cardiovascular: Normal rate, regular rhythm and intact distal  pulses.   Pulmonary/Chest: Effort normal and breath sounds normal. No respiratory distress. She has no wheezes. She has no rales. She exhibits no tenderness.  Abdominal: Soft. Normal appearance and bowel sounds are normal. She exhibits no distension and no mass. There is tenderness in the epigastric area. There is no rigidity, no rebound, no guarding, no CVA tenderness, no tenderness at McBurney's point and negative Murphy's sign.  Musculoskeletal: Normal range of motion. She exhibits no edema and no tenderness.  Lymphadenopathy:    She has no cervical adenopathy.  Neurological: She is alert and oriented to person, place, and time. She exhibits normal muscle tone. Coordination normal.  Skin: Skin is warm and dry. No rash noted. No erythema.  Psychiatric: She has a normal mood and affect.    ED Course  Procedures (including critical care time)  Labs Reviewed  WET PREP, GENITAL - Abnormal; Notable for the following:    Clue Cells Wet Prep HPF POC FEW (*)    WBC, Wet Prep HPF POC MANY (*)    All other components within normal limits  URINALYSIS, ROUTINE W REFLEX MICROSCOPIC - Abnormal; Notable for the following:    APPearance HAZY (*)    Leukocytes, UA LARGE (*)    All other components within normal limits  CBC WITH DIFFERENTIAL - Abnormal; Notable for the following:    Hemoglobin 11.9 (*)    HCT 35.4 (*)    All other components within normal limits  COMPREHENSIVE METABOLIC PANEL - Abnormal; Notable for the following:    Sodium 134 (*)    Glucose, Bld 124 (*)    Albumin 3.4 (*)    Total Bilirubin 0.2 (*)    GFR calc non Af Amer 87 (*)    All other components within normal limits  URINE MICROSCOPIC-ADD ON - Abnormal; Notable for the following:    Squamous Epithelial / LPF MANY (*)    Bacteria, UA FEW (*)    All other components within normal limits  GC/CHLAMYDIA PROBE AMP  URINE CULTURE  PREGNANCY, URINE  LIPASE, BLOOD   No results found.   1. Gastritis   2. Concern about  STD in female without diagnosis       MDM  Tracy Vaughn presents with nausea, vomiting and epigastric pain. Patient with symptoms consistent with viral gastritis.  Vitals are stable, no fever.  Patient is nontoxic, nonseptic appearing, in no apparent distress.  Patient does not meet the SIRS or Sepsis criteria.  Pt's symptoms have been managed in the department; fluid bolus given.  No signs of dehydration, tolerating PO fluids > 6 oz.  Lungs are clear.  No focal abdominal pain, no peritoneal signs, no concern for appendicitis, cholecystitis, pancreatitis, ruptured viscus, UTI, kidney stone, PID, ectopic pregnancy or any other abdominal etiology.  Pt with many WBCs on wet prep and prophylactic treatment for GC and Chlamydia while cultures are pending.  Pt not concerning for PID because hemodynamically stable, afebrile and no cervical motion tenderness or adnexal tenderness on  pelvic exam. Supportive therapy indicated with return if symptoms worsen.  Patient counseled.  I have also discussed reasons to return immediately to the ER.  Patient expresses understanding and agrees with plan.          Tracy Client Maleeha Halls, PA-C 11/07/12 0121

## 2012-11-06 NOTE — ED Notes (Signed)
The pt is c/o taking a mental health med last pm and has had abd pain since then.  Unable to uhnderstand the name of the meds she i s taking besides lithium.  lmp last month

## 2012-11-06 NOTE — ED Notes (Signed)
Pt c/o abd pain that started last night around 11pm, sts she started a new medication last night around 7pm. Pt c/o abd pain that starts in mid-epigastric area and goes straight down. Pt reports she started having n/v last night, denies diarrhea. sts regualr BM. Pt in nad, skin warm and dry, resp e/u.

## 2012-11-06 NOTE — ED Notes (Signed)
Pt able to eat sandwich and keep food down.

## 2012-11-06 NOTE — ED Notes (Signed)
Pt vomited X1.  

## 2012-11-06 NOTE — ED Notes (Signed)
Pt reports she is also having vaginal odor and itchy X 1 month. Pt sts it also hurts to urinate.

## 2012-11-07 NOTE — ED Provider Notes (Signed)
Medical screening examination/treatment/procedure(s) were performed by non-physician practitioner and as supervising physician I was immediately available for consultation/collaboration.   David H Yao, MD 11/07/12 1501 

## 2012-11-08 LAB — URINE CULTURE

## 2012-11-08 LAB — GC/CHLAMYDIA PROBE AMP: CT Probe RNA: NEGATIVE

## 2012-11-09 ENCOUNTER — Telehealth (HOSPITAL_COMMUNITY): Payer: Self-pay | Admitting: Emergency Medicine

## 2012-11-11 ENCOUNTER — Telehealth (HOSPITAL_COMMUNITY): Payer: Self-pay | Admitting: Emergency Medicine

## 2013-01-20 ENCOUNTER — Encounter (HOSPITAL_COMMUNITY): Payer: Self-pay | Admitting: Emergency Medicine

## 2013-01-20 ENCOUNTER — Emergency Department (HOSPITAL_COMMUNITY)
Admission: EM | Admit: 2013-01-20 | Discharge: 2013-01-20 | Disposition: A | Discharge status: Other Acute Inpatient Hospital | Payer: Medicaid Other | Attending: Emergency Medicine | Admitting: Emergency Medicine

## 2013-01-20 DIAGNOSIS — R45851 Suicidal ideations: Secondary | ICD-10-CM | POA: Insufficient documentation

## 2013-01-20 DIAGNOSIS — Z0289 Encounter for other administrative examinations: Secondary | ICD-10-CM | POA: Insufficient documentation

## 2013-01-20 DIAGNOSIS — Z79899 Other long term (current) drug therapy: Secondary | ICD-10-CM | POA: Insufficient documentation

## 2013-01-20 DIAGNOSIS — R443 Hallucinations, unspecified: Secondary | ICD-10-CM | POA: Insufficient documentation

## 2013-01-20 DIAGNOSIS — R259 Unspecified abnormal involuntary movements: Secondary | ICD-10-CM | POA: Insufficient documentation

## 2013-01-20 DIAGNOSIS — Z008 Encounter for other general examination: Secondary | ICD-10-CM

## 2013-01-20 DIAGNOSIS — F79 Unspecified intellectual disabilities: Secondary | ICD-10-CM | POA: Insufficient documentation

## 2013-01-20 DIAGNOSIS — F17200 Nicotine dependence, unspecified, uncomplicated: Secondary | ICD-10-CM | POA: Insufficient documentation

## 2013-01-20 NOTE — ED Provider Notes (Signed)
History     CSN: [id number]  Arrival date & time 01/20/13  1341   First MD Initiated Contact with Patient 01/20/13 1501      Chief Complaint  Patient presents with  . Labs Only    (Consider location/radiation/quality/duration/timing/severity/associated sxs/prior treatment) HPI Is a patient was sent from [facility] for lab test. The notes from [place] states they do not want anything else done. Patient's been hallucinating and has had a depression. She was sent for lithium level. Patient states she does not feel that. She states she feels depressed.  Past Medical History  Diagnosis Date  . Mental impairment     History reviewed. No pertinent past surgical history.  No family history on file.  History  Substance Use Topics  . Smoking status: Current Every Day Smoker  . Smokeless tobacco: Not on file  . Alcohol Use: No    OB History   Grav Para Term Preterm Abortions TAB SAB Ect Mult Living                  Review of Systems  Respiratory: Negative for chest tightness and shortness of breath.   Cardiovascular: Negative for chest pain.  Skin: Negative for rash.  Hematological: Does not bruise/bleed easily.  Psychiatric/Behavioral: Positive for suicidal ideas and hallucinations.    Allergies  Fanapt; Lithium; and Novocain  Home Medications   Current Outpatient Rx  Name  Route  Sig  Dispense  Refill  . hydrOXYzine (ATARAX/VISTARIL) 50 MG tablet   Oral   Take 50 mg by mouth at bedtime.         Marland Kitchen lithium carbonate 300 MG capsule   Oral   Take 600 mg by mouth at bedtime.          . paliperidone (INVEGA) 6 MG 24 hr tablet   Oral   Take 12 mg by mouth daily.         . vitamin C (ASCORBIC ACID) 500 MG tablet   Oral   Take 500 mg by mouth daily.           BP 121/76  Pulse 114  Temp(Src) 98.7 F (37.1 C) (Oral)  Resp 20  SpO2 98%  Physical Exam  Constitutional: She is oriented to person, place, and time. She appears well-developed and  well-nourished.  Cardiovascular: Normal rate and regular rhythm.   Pulmonary/Chest: Effort normal and breath sounds normal.  Neurological: She is alert and oriented to person, place, and time.  Reflexes are not increased. Mild tremor.  Psychiatric:  Patient appears awake and appropriate. She appears somewhat depressed.    ED Course  Procedures (including critical care time)  Labs Reviewed  LITHIUM LEVEL - Abnormal; Notable for the following:    Lithium Lvl 0.38 (*)    All other components within normal limits   No results found.   1. Medical clearance for psychiatric admission       MDM  *Patient sent for lithium level to make sure nontoxic. The level is slightly low. Will discharge back to [facility] R. Rubin Payor, MD 01/20/13 [phone]

## 2013-01-20 NOTE — ED Notes (Signed)
Debbie at [facility] verbalized on phone that she received the lithium level.

## 2013-01-20 NOTE — ED Notes (Signed)
Pt is brought in by [facility] and is to have a lithium level drawn and is to return back to [facility] after faxed results.

## 2013-01-20 NOTE — ED Notes (Signed)
Pt being sent from [facility], pt only needs blood work drawn for lithium level and nothing else. Please fax results back to [facility]. [facility] worried about lithium toxicity.

## 2017-07-15 ENCOUNTER — Ambulatory Visit: Payer: Self-pay | Admitting: Registered"

## 2017-07-30 ENCOUNTER — Encounter: Payer: Self-pay | Admitting: Registered"

## 2017-07-30 ENCOUNTER — Encounter: Payer: Medicaid Other | Attending: Internal Medicine | Admitting: Registered"

## 2017-07-30 DIAGNOSIS — E119 Type 2 diabetes mellitus without complications: Secondary | ICD-10-CM | POA: Insufficient documentation

## 2017-07-30 DIAGNOSIS — Z713 Dietary counseling and surveillance: Secondary | ICD-10-CM | POA: Insufficient documentation

## 2017-07-30 DIAGNOSIS — E118 Type 2 diabetes mellitus with unspecified complications: Secondary | ICD-10-CM | POA: Insufficient documentation

## 2017-07-30 NOTE — Progress Notes (Signed)
Diabetes Self-Management Education  Visit Type: First/Initial  Appt. Start Time: 1100 Appt. End Time: 1215  07/30/2017  [name], identified by name and date of birth, is a 41 y.o. female with a diagnosis of Diabetes: Type 2.   ASSESSMENT  Height 5\' 7"  (1.702 m), weight (!) 302 lb 4.8 oz (137.1 kg). Body mass index is 47.35 kg/m.   Patient states she eats traditional African food including plantains, rice and meat, eggplant stew. Patient reports her mother, who lives with her, has had T2DM for a long time and eats better and more consistent. Patient reports that her mother puts vegetables in a ziplock bag and microwaves them.    Patient states her fingers are getting sore from checking her blood sugar. RD taught patient how to reduce the depth of needle on her lancing device. Pt stated she is interested in the continuous monitoring system.   Patient states she was doing better with her weight when she was exercising regularly (reports she previously weighed 310 lbs), but states depression from her mental illness makes it hard for her to be motivated to exercise. Patient states she goes to gym 2x week after dropping child off at school.  Patient states she has nerve pain in left leg from knee to foot and takes gabapentin. At next visit RD to review foot care. RD also plans to review proper microwave use for cooking vegetables (not plastic bags unless commercially packaged for microwave)  Diabetes Self-Management Education - 07/30/17 1104      Visit Information   Visit Type  First/Initial      Initial Visit   Diabetes Type  Type 2    Are you currently following a meal plan?  No    Are you taking your medications as prescribed?  Yes    Date Diagnosed  05/08/2016      Health Coping   How would you rate your overall health?  Fair      Psychosocial Assessment   Patient Belief/Attitude about Diabetes  Motivated to manage diabetes    How often do you need to have someone help you  when you read instructions, pamphlets, or other written materials from your doctor or pharmacy?  1 - Never    What is the last grade level you completed in school?  12      Complications   Last HgB A1C per patient/outside source  7.5 % per referral labs 03/20/17    How often do you check your blood sugar?  1-2 times/day    Fasting Blood glucose range (mg/dL)  130-179;180-200 150-206    Postprandial Blood glucose range (mg/dL)  130-179 140-180    Number of hypoglycemic episodes per month  0    Number of hyperglycemic episodes per week  6    Can you tell when your blood sugar is high?  Yes    What do you do if your blood sugar is high?  sometimes feel dizzy    Have you had a dilated eye exam in the past 12 months?  No    Have you had a dental exam in the past 12 months?  Yes    Are you checking your feet?  No      Dietary Intake   Breakfast  bread, bologna, water, OJ    Snack (morning)  none    Lunch  1 c rice, soup African meat, eggplant,    Snack (afternoon)  none    Dinner  rice or  plantain, soup    Snack (evening)  plantain and sardines    Beverage(s)  water, OJ      Exercise   Exercise Type  Light (walking / raking leaves)    How many days per week to you exercise?  2    How many minutes per day do you exercise?  30    Total minutes per week of exercise  60      Patient Education   Previous Diabetes Education  No    Disease state   Definition of diabetes, type 1 and 2, and the diagnosis of diabetes    Nutrition management   Role of diet in the treatment of diabetes and the relationship between the three main macronutrients and blood glucose level;Carbohydrate counting;Food label reading, portion sizes and measuring food.    Physical activity and exercise   Role of exercise on diabetes management, blood pressure control and cardiac health.    Medications  Reviewed patients medication for diabetes, action, purpose, timing of dose and side effects.    Monitoring  Identified  appropriate SMBG and/or A1C goals.;Taught/evaluated SMBG meter.    Acute complications  Discussed and identified patients' treatment of hyperglycemia.    Chronic complications  Relationship between chronic complications and blood glucose control;Retinopathy and reason for yearly dilated eye exams      Individualized Goals (developed by patient)   Nutrition  General guidelines for healthy choices and portions discussed    Physical Activity  Exercise 3-5 times per week      Outcomes   Expected Outcomes  Demonstrated interest in learning. Expect positive outcomes    Future DMSE  4-6 wks    Program Status  Not Completed     Individualized Plan for Diabetes Self-Management Training:   Learning Objective:  Patient will have a greater understanding of diabetes self-management. Patient education plan is to attend individual and/or group sessions per assessed needs and concerns.  Patient Instructions  Plan:  Aim for 2-4 Carb Choices per meal (30-60 grams)  Aim for 0-1 Carb Choices per snack if hungry (0-15 grams) Include protein with your meals and snacks Aim for 3 balanced meals per day, with not to heavy of a supper meal and not too late Consider reading food labels for Total Carbohydrate and Fat Grams of foods Consider increasing your activity level aim for 3-5 times per week. Continue checking blood sugar at alternate times per day as directed by MD Continue taking medication as directed by MD Consider taking a vitamin B12 supplement for nerve health  Expected Outcomes:  Demonstrated interest in learning. Expect positive outcomes  Education material provided: Living Well with Diabetes, A1C conversion sheet and Carbohydrate counting sheet, freestyle libre brochure hyper/hypoglycermia signs and symptoms  If problems or questions, patient to contact team via:  Phone  Future DSME appointment: 4-6 wks

## 2017-07-30 NOTE — Patient Instructions (Signed)
Plan:  Aim for 2-4 Carb Choices per meal (30-60 grams)  Aim for 0-1 Carb Choices per snack if hungry (0-15 grams) Include protein with your meals and snacks Aim for 3 balanced meals per day, with not to heavy of a supper meal and not too late Consider reading food labels for Total Carbohydrate and Fat Grams of foods Consider increasing your activity level aim for 3-5 times per week. Continue checking blood sugar at alternate times per day as directed by MD Continue taking medication as directed by MD Consider taking a vitamin B12 supplement for nerve health

## 2017-09-07 ENCOUNTER — Encounter: Payer: Medicaid Other | Attending: Internal Medicine | Admitting: Registered"

## 2017-09-07 DIAGNOSIS — E119 Type 2 diabetes mellitus without complications: Secondary | ICD-10-CM | POA: Insufficient documentation

## 2017-09-07 DIAGNOSIS — Z713 Dietary counseling and surveillance: Secondary | ICD-10-CM | POA: Diagnosis present

## 2017-09-07 DIAGNOSIS — E118 Type 2 diabetes mellitus with unspecified complications: Secondary | ICD-10-CM

## 2017-09-07 NOTE — Progress Notes (Signed)
Diabetes Self-Management Education  Visit Type: Follow-up  Appt. Start Time: 1100 Appt. End Time: 1130  09/07/2017  [name], identified by name and date of birth, is a 42 y.o. female with a diagnosis of Diabetes: Type 2.   ASSESSMENT Patient states she is eating less rice since last appointment. Patient states she would like to eat more fruit and vegetables, but does not have money to buy them. Patient states she does not use Internet or e-mail for obtaining information and asked for directions to the [employer]. RD provided a map and details for getting additional SNAP benefits to purchase produce.  RD reviewed proper microwaving techniques.  Diabetes Self-Management Education - 09/07/17 1056      Visit Information   Visit Type  Follow-up      Initial Visit   Diabetes Type  Type 2    Are you taking your medications as prescribed?  Yes      Complications   How often do you check your blood sugar?  1-2 times/day not checking now, needs to replace battery    Fasting Blood glucose range (mg/dL)  130-179 runs about 140    Postprandial Blood glucose range (mg/dL)  70-129 125 before bed - not timing with meal intake    Number of hypoglycemic episodes per month  0      Dietary Intake   Lunch  1-2 c rice,    Snack (evening)  plantain, sardines    Beverage(s)  water OJ      Exercise   Exercise Type  Light (walking / raking leaves) step exercise in home    How many days per week to you exercise?  1      Patient Education   Nutrition management   Reviewed blood glucose goals for pre and post meals and how to evaluate the patients' food intake on their blood glucose level.;Meal options for control of blood glucose level and chronic complications.    Monitoring  Purpose and frequency of SMBG.      Individualized Goals (developed by patient)   Nutrition  General guidelines for healthy choices and portions discussed reduce plantain & juice intake    Monitoring   test my  blood glucose as discussed      Outcomes   Expected Outcomes  Demonstrated interest in learning. Expect positive outcomes    Future DMSE  4-6 wks    Program Status  Not Completed      Subsequent Visit   Since your last visit have you had your blood pressure checked?  Yes    Is your most recent blood pressure lower, unchanged, or higher since your last visit?  Unchanged    Since your last visit have you experienced any weight changes?  Gain    Weight Gain (lbs)  4    Since your last visit, are you checking your blood glucose at least once a day?  Yes     Individualized Plan for Diabetes Self-Management Training:   Learning Objective:  Patient will have a greater understanding of diabetes self-management. Patient education plan is to attend individual and/or group sessions per assessed needs and concerns.   Patient Instructions  Continue to check fasting (morning) blood sugar, and change the timing of night checking to 2 hours after you eat.  Consider getting fresh produce at the [employer] double [employer]. Satrudays 9 am- noon. Also use the list of sources that are supplying food boxes. Consider having less  plantain (1/2) and be sure to have protein with it When deciding on Gatorade, read the label and look for 5 grams or less of carbohydrate. Zero is okay. Consider having whole fruit instead of juice. Consider taking the log of your blood sugar number to your next visit with your primary doctor. When cooking in microwave consider using glass or other microwave save dishes, avoid plastic containers.  Expected Outcomes:  Demonstrated interest in learning. Expect positive outcomes  Education material provided: Carbohydrate counting sheet, directions to farmers market  If problems or questions, patient to contact team via:  Phone  Future DSME appointment: 4-6 wks

## 2017-09-07 NOTE — Patient Instructions (Addendum)
Continue to check fasting (morning) blood sugar, and change the timing of night checking to 2 hours after you eat.  Consider getting fresh produce at the [employer]. Satrudays 9 am- noon. Also use the list of sources that are supplying food boxes. Consider having less plantain (1/2) and be sure to have protein with it When deciding on Gatorade, read the label and look for 5 grams or less of carbohydrate. Zero is okay. Consider having whole fruit instead of juice. Consider taking the log of your blood sugar number to your next visit with your primary doctor. When cooking in microwave consider using glass or other microwave save dishes, avoid plastic containers.

## 2017-09-22 ENCOUNTER — Other Ambulatory Visit: Payer: Self-pay | Admitting: Gynecology

## 2017-09-22 DIAGNOSIS — N911 Secondary amenorrhea: Secondary | ICD-10-CM

## 2017-10-05 ENCOUNTER — Ambulatory Visit: Payer: Self-pay | Admitting: Registered"

## 2017-10-06 ENCOUNTER — Ambulatory Visit
Admission: RE | Admit: 2017-10-06 | Discharge: 2017-10-06 | Disposition: A | Discharge status: Home / Self Care | Payer: Medicaid Other | Source: Ambulatory Visit | Attending: Gynecology | Admitting: Gynecology

## 2017-10-06 DIAGNOSIS — N911 Secondary amenorrhea: Secondary | ICD-10-CM

## 2017-10-06 MED ORDER — GADOBENATE DIMEGLUMINE 529 MG/ML IV SOLN
10.0000 mL | Freq: Once | INTRAVENOUS | Status: AC | PRN
  Administered 2017-10-06: 10 mL via INTRAVENOUS

## 2017-12-08 ENCOUNTER — Encounter: Payer: Self-pay | Admitting: Internal Medicine

## 2017-12-08 ENCOUNTER — Ambulatory Visit (INDEPENDENT_AMBULATORY_CARE_PROVIDER_SITE_OTHER): Payer: Medicaid Other | Admitting: Internal Medicine

## 2017-12-08 VITALS — BP 116/72 | HR 91 | Ht 67.0 in | Wt 294.4 lb

## 2017-12-08 DIAGNOSIS — E221 Hyperprolactinemia: Secondary | ICD-10-CM | POA: Insufficient documentation

## 2017-12-08 DIAGNOSIS — D352 Benign neoplasm of pituitary gland: Secondary | ICD-10-CM | POA: Diagnosis not present

## 2017-12-08 NOTE — Patient Instructions (Addendum)
Please come back for labs at 8 am.  Come back for another appt in September.  Please let Malachi Paradise, NP what we discussed.

## 2017-12-08 NOTE — Progress Notes (Signed)
Patient ID: Tracy Vaughn, female   DOB: [birth year], 42 y.o.   MRN: [id number]    HPI  Tracy Vaughn is a 42 y.o.-year-old female, referred by her her OB/GYN provider, Andree Moro Key, FNP, for evaluation and management of pituitary microadenoma and hyperprolactinemia.  Pt. has been dx with a pituitary adenoma in 08/2017, during investigation for amenorrhea for 3 years.  At that time, she had an elevated prolactin. A pituitary MRI obtained later showed a microadenoma.  Of note, she is on Paliperidone inj q3 mo. Previously, on Risperdal up until 2 years ago. Psychiatrist: Malachi Paradise, NP  [facility] [phone]) - sees him q3 mo.  Pt denies: - visual problems (other than glasses)  But has: - headaches - q2-3 weeks - slight galactorrhea with nipple manipulation (squeezing) - weight gain - 150 lbs since she started psychotropic meds in 2012 >> recently started to  improve her eating habits and to exercise - acne on face - always had this - increased show size and ring size - developed diabetes ~1 year ago - on metformin   Reviewed pituitary MRI (10/06/2017) results: Pituitary overall is small in size measuring approximately 2 mm in height.  Focal nodularity in the right side of the pituitary which shows differential hypoenhancement measuring 5 mm in diameter and suspicious for a microadenoma.  No invasion of the cavernous sinus.  No compression of the optic chiasm.  Reviewed prolactin levels: 09/02/2017: Prolactin 166.5 No results found for: PROLACTIN   Other pertinent labs: 09/02/2017: [place] 0.5 08/19/2017: FSH 0.6  TSH was normal:  08/19/2017: TSH 0.934 Lab Results  Component Value Date   TSH 1.668 08/27/2010    Pt. also has a history of mental illness, DM2, GERD, HL.  ROS: Constitutional: + See HPI, no subjective hyperthermia/hypothermia Eyes: + blurry vision, no xerophthalmia ENT: no sore throat, + nodules palpated in throat, no dysphagia/odynophagia, no  hoarseness Cardiovascular: no CP/SOB/palpitations/leg swelling Respiratory: no cough/SOB Gastrointestinal: + N/no V/D/C/+ heartburn Musculoskeletal: no muscle/joint aches Skin: no rashes Neurological: no tremors/numbness/tingling/dizziness, + headaches Psychiatric: + Both depression/anxiety + low libido + Amenorrhea  Past Medical History:  Diagnosis Date  . Mental impairment    History reviewed. No pertinent surgical history. Social History   Socioeconomic History  . Marital status: Legally Separated/single    Spouse name: Not on file  . Number of children: 3: (2019): 37, 85, 42 y/o  Occupational History  .  Unemployed  Tobacco Use  . Smoking status: Current Every Day Smoker  Substance and Sexual Activity  . Alcohol use: No  . Drug use: No   Current Outpatient Medications on File Prior to Visit  Medication Sig Dispense Refill  . benztropine (COGENTIN) 2 MG tablet Take 2 mg by mouth 2 (two) times daily.    . Cholecalciferol (VITAMIN D3) 5000 units CAPS Take by mouth.    . doxycycline (DORYX) 100 MG EC tablet Take 100 mg by mouth 2 (two) times daily.    Marland Kitchen gabapentin (NEURONTIN) 300 MG capsule Take 300 mg by mouth 3 (three) times daily.    . hydrOXYzine (ATARAX/VISTARIL) 50 MG tablet Take 50 mg by mouth at bedtime.    Marland Kitchen lithium carbonate 300 MG capsule Take 600 mg by mouth at bedtime.     . metFORMIN (GLUCOPHAGE) 500 MG tablet Take by mouth 2 (two) times daily with a meal.    . omeprazole (PRILOSEC) 20 MG capsule Take 20 mg by mouth daily.    . paliperidone (INVEGA) 6  MG 24 hr tablet Take 12 mg by mouth daily.    . rosuvastatin (CRESTOR) 40 MG tablet Take 40 mg by mouth daily.    Marland Kitchen venlafaxine (EFFEXOR) 37.5 MG tablet Take 37.5 mg by mouth 2 (two) times daily.    . vitamin C (ASCORBIC ACID) 500 MG tablet Take 500 mg by mouth daily.     No current facility-administered medications on file prior to visit.    Allergies  Allergen Reactions  . Fanapt [Iloperidone]      Unknown, but takes the cogentin to fix the side effects  . Lithium     Takes cogentin to adverse reaction to medication  . Novocain [Procaine] Rash    bumps   No pertinent family history.  PE: BP 116/72   Pulse 91   Ht 5\' 7"  (1.702 m)   Wt 294 lb 6.4 oz (133.5 kg)   SpO2 97%   BMI 46.11 kg/m  Wt Readings from Last 3 Encounters:  12/08/17 294 lb 6.4 oz (133.5 kg)  07/30/17 (!) 302 lb 4.8 oz (137.1 kg)   Constitutional: overweight, in NAD Eyes: PERRLA, EOMI, no exophthalmos ENT: moist mucous membranes, no thyromegaly, no cervical lymphadenopathy Cardiovascular: RRR, No MRG Respiratory: CTA B Gastrointestinal: abdomen soft, NT, ND, BS+ Musculoskeletal: no deformities, strength intact in all 4 Skin: moist, warm, no rashes Neurological: no tremor with outstretched hands, DTR normal in all 4  ASSESSMENT: 1. Pituitary microadenoma  2.  Hyperprolactinemia  PLAN:  1. Patient with a newly diagnosed pituitary microadenoma, found after a prolactin level returned elevated during investigation for amenorrhea. - I reviewed the pituitary MRI images and report along with the patient.Since the tumor is under 1 cm, this qualifies as a micro-, rather than a macro-adenoma.  - I explained that this is not a "brain tumor". These tumors are extremely rarely malignant, the vast majority of them are benign.  - The pituitary adenoma can be:  Not producing hormones, not compressing the pituitary gland or the optic chiasm  Not producing hormones, but compressing either the pituitary gland (causing hypopituitarism) or the optic chiasm (causing visual field cuts)  Producing hormones: - Prolactin (prolactinoma) - ACTH (Cushing's disease) - she has weight gain but she does not have central distribution of fat, wide, purple, stretch marks, full supraclavicular fat pads, hypertension.  She does have diabetes though. - growth hormone (acromegaly) - No enlarged jaw, but she did have increase in the size  of her hands and feet, although I suspect that this is related to her significant weight gain - LH or FSH (gonadotropin secreting tumor) - FSH was low x2 recently, most likely due to high prolactin - TSH (TSH secreting tumor) (very rare) - recent TSH has been normal, but I do not have associated free T4 and free T3 with it - we will need to check the above hormones - I ordered the following labs fasting, at 8 AM: Orders Placed This Encounter  Procedures  . ACTH  . Cortisol  . TSH  . T4, free  . T3, free  . Luteinizing hormone  . Insulin-like growth factor  . Prolactin  - Return in about 4 months (around 09/30/2015).  2. HPRL - Patient has an elevated prolactin  while on  Paliperidone Lorayne Bender), which is related to risperidone, both medications that can significantly elevate the prolactin levels.  It is not clear at this point whether the high prolactin level is related to her pituitary microadenoma or to her treatment with this  medication - Unfortunately, despite the fact that we have a good treatment for hyperprolactinemia, cabergoline, this is a dopamine agonist, and is contraindicated in patients that are treated with Risperdal or Invega as cabergoline will negate the effect of the psychotropic.  Therefore, if the medication cannot be changed, we will need to normalize her sex hormone levels by using oral contraceptives for the next 10 years and then hormone replacement if needed afterwards. - She, however, refuses OCPs/HRT as she would like to have another child.  In this case, the only way that I can see to decrease her prolactin is to switch to another class of atypical antipsychotics and if the prolactin level does not come down in few months afterwards, then to start cabergoline. - I would like to get in touch with her psychiatrist and see if there is anything else that he can treat her with aside Invega/Risperdal.  - In case we do end up starting cabergoline, we discussed about benefits  and possible side effects  12/24/17 Addendum: Patient did not return for labs yet.  Philemon Kingdom, MD PhD [facility]

## 2017-12-22 ENCOUNTER — Telehealth: Payer: Self-pay | Admitting: Internal Medicine

## 2017-12-22 NOTE — Telephone Encounter (Signed)
Done

## 2017-12-22 NOTE — Telephone Encounter (Signed)
[facility] would like the Office notes from new patient visit faxed over to their office.  AttnLesia Hausen  [phone]

## 2018-04-15 ENCOUNTER — Encounter: Payer: Self-pay | Admitting: Internal Medicine

## 2018-04-15 ENCOUNTER — Ambulatory Visit (INDEPENDENT_AMBULATORY_CARE_PROVIDER_SITE_OTHER): Payer: Medicaid Other | Admitting: Internal Medicine

## 2018-04-15 VITALS — BP 140/78 | HR 81 | Ht 67.0 in | Wt 296.2 lb

## 2018-04-15 DIAGNOSIS — E221 Hyperprolactinemia: Secondary | ICD-10-CM

## 2018-04-15 DIAGNOSIS — D352 Benign neoplasm of pituitary gland: Secondary | ICD-10-CM | POA: Diagnosis not present

## 2018-04-15 LAB — CORTISOL: CORTISOL PLASMA: 8.6 ug/dL

## 2018-04-15 LAB — LUTEINIZING HORMONE: LH: 0.36 m[IU]/mL

## 2018-04-15 LAB — TSH: TSH: 0.78 u[IU]/mL (ref 0.35–4.50)

## 2018-04-15 LAB — T3, FREE: T3 FREE: 3.3 pg/mL (ref 2.3–4.2)

## 2018-04-15 LAB — T4, FREE: FREE T4: 0.73 ng/dL (ref 0.60–1.60)

## 2018-04-15 NOTE — Progress Notes (Signed)
Patient ID: Tracy Vaughn, female   DOB: 06/28/1976, 42 y.o.   MRN: [id number]    HPI  Tracy Vaughn is a 42 y.o.-year-old female, initially referred by her her OB/GYN provider, Andree Moro Key, FNP, for evaluation and management of pituitary microadenoma and hyperprolactinemia.  Last visit 4 months ago.  Patient has been found to have a pituitary adenoma in 08/2017, during investigation for amenorrhea for 3 years.  At that time, she had an elevated prolactin.  The pituitary MRI showed a microadenoma, of 5 mm.  At last visit, we discussed that her paliperidone injections (she gets these every 3 months) are most likely raising her prolactin.  She was previously on Risperdal up to until 2.5 years ago. Psychiatrist: Malachi Paradise, NP  [facility] [phone]) - sees him q3 mo.  I advised her to discuss with her psychiatrist to see if paliperidone can be changed to another medicine that does not raise prolactin.  If it is possible, then we decided to follow the prolactin level and treat this with cabergoline only if not decreasing to normal.  If not possible, will need to start her on hormone replacement.  We discussed about risks of not having menstrual cycles, especially the risk of developing early osteoporosis.  However, she is telling me that she wants to have another child and refuses OCP/HRT for now. She tells me that her husband is in [place] and comes occasionally.    I also ordered a more in-depth pituitary evaluation at last visit, but patient did not return for labs as she forgot...  She denies any visual problems.  However, she has: Headaches every 2 to 3 weeks-chronic Continues to have alactorrhea with nipple manipulation Amenorrhea Acne on face- chronic Weight gain- she gained 150 pounds since she started psychotropic medications in 2012, but she started to lose the weight after changing her eating habits and exercising before last visit Developed diabetes approximately 1.5 years  ago.  She is on metformin.  Pituitary MRI (10/06/2017): Pituitary overall is small in size measuring approximately 2 mm in height.  Focal nodularity in the right side of the pituitary which shows differential hypoenhancement measuring 5 mm in diameter and suspicious for a microadenoma.  No invasion of the cavernous sinus.  No compression of the optic chiasm.  Reviewed prolactin levels: 09/02/2017: Prolactin 166.5 No results found for: PROLACTIN   Other pertinent labs reviewed: 09/02/2017: [place] 0.5 08/19/2017: FSH 0.6  TSH was normal 08/19/2017: TSH 0.934 Lab Results  Component Value Date   TSH 1.668 08/27/2010    Pt. also has a history of mental illness, type 2 diabetes, GERD, HL  ROS: Constitutional: no weight gain/no weight loss, no fatigue, no subjective hyperthermia, no subjective hypothermia Eyes: + Blurry vision, no xerophthalmia ENT: no sore throat, no nodules palpated in throat, no dysphagia, no odynophagia, no hoarseness Cardiovascular: no CP/no SOB/no palpitations/no leg swelling Respiratory: no cough/no SOB/no wheezing Gastrointestinal: no N/no V/no D/no C/no acid reflux Musculoskeletal: no muscle aches/no joint aches Skin: no rashes, no hair loss Neurological: no tremors/no numbness/no tingling/no dizziness, + headaches + Low libido, + amenorrhea  I reviewed pt's medications, allergies, PMH, social hx, family hx, and changes were documented in the history of present illness. Otherwise, unchanged from my initial visit note.  Past Medical History:  Diagnosis Date  . Mental impairment    No past surgical history on file. Social History   Socioeconomic History  . Marital status: Legally Separated/single    Spouse name: Not  on file  . Number of children: 3: (2019): 27, 24, 57 y/o  Occupational History  .  Unemployed  Tobacco Use  . Smoking status: Current Every Day Smoker  Substance and Sexual Activity  . Alcohol use: No  . Drug use: No   Current Outpatient  Medications on File Prior to Visit  Medication Sig Dispense Refill  . benztropine (COGENTIN) 2 MG tablet Take 2 mg by mouth 2 (two) times daily.    . Cholecalciferol (VITAMIN D3) 5000 units CAPS Take by mouth.    . doxycycline (DORYX) 100 MG EC tablet Take 100 mg by mouth 2 (two) times daily.    Marland Kitchen gabapentin (NEURONTIN) 300 MG capsule Take 300 mg by mouth 3 (three) times daily.    . hydrOXYzine (ATARAX/VISTARIL) 50 MG tablet Take 50 mg by mouth at bedtime.    Marland Kitchen lithium carbonate 300 MG capsule Take 600 mg by mouth at bedtime.     . metFORMIN (GLUCOPHAGE) 500 MG tablet Take by mouth 2 (two) times daily with a meal.    . omeprazole (PRILOSEC) 20 MG capsule Take 20 mg by mouth daily.    . paliperidone (INVEGA) 6 MG 24 hr tablet Take 12 mg by mouth daily.    . rosuvastatin (CRESTOR) 40 MG tablet Take 40 mg by mouth daily.    Marland Kitchen venlafaxine (EFFEXOR) 37.5 MG tablet Take 37.5 mg by mouth 2 (two) times daily.    . vitamin C (ASCORBIC ACID) 500 MG tablet Take 500 mg by mouth daily.     No current facility-administered medications on file prior to visit.    Allergies  Allergen Reactions  . Fanapt [Iloperidone]     Unknown, but takes the cogentin to fix the side effects  . Lithium     Takes cogentin to adverse reaction to medication  . Novocain [Procaine] Rash    bumps   No pertinent family history.  PE: BP 140/78   Pulse 81   Ht 5\' 7"  (1.702 m)   Wt 296 lb 3.2 oz (134.4 kg)   SpO2 97%   BMI 46.39 kg/m  Wt Readings from Last 3 Encounters:  04/15/18 296 lb 3.2 oz (134.4 kg)  12/08/17 294 lb 6.4 oz (133.5 kg)  07/30/17 (!) 302 lb 4.8 oz (137.1 kg)   Constitutional: overweight, in NAD Eyes: PERRLA, EOMI, no exophthalmos ENT: moist mucous membranes, no thyromegaly, no cervical lymphadenopathy Cardiovascular: RRR, No MRG Respiratory: CTA B Gastrointestinal: abdomen soft, NT, ND, BS+ Musculoskeletal: no deformities, strength intact in all 4 Skin: moist, warm, no rashes Neurological:  no tremor with outstretched hands, DTR normal in all 4  ASSESSMENT: 1. Pituitary microadenoma  2.  Hyperprolactinemia  PLAN:  1. Patient with recently diagnosed pituitary adenoma, found after prolactin level returned elevated during investigation for amenorrhea.  Reviewed the report of her pituitary MRI from 10/2017 and this shows a 5 mm pituitary nodule, a microadenoma.  At last visit, we discussed that these tumors could be silent, or producing hormones.  I suggested that she comes back in a.m. for a pituitary hormone check, but she forgot.   - We will check these today along with another prolactin level  2.  Hyperprolactinemia -Patient had an elevated prolactin level while on paliperidone (Invega injections) -this is related to risperidone, both medications that can significantly elevated prolactin levels.  Due to the high prolactin level, I suspect that her prolactin excess is actually due to the psychotropic medication, rather than the pituitary microadenoma. -She  continues to have galactorrhea at stimulation of the nipple and also amenorrhea -At last visit, I recommended to discuss with her psychiatrist to see if the medication can be changed to another, which does not elevate prolactin.  She did that but she was advised that it would be better for her to continue on the medication.  In this case, we discussed that we cannot use cabergoline or bromocriptine, as dopamine agonists will also antagonize the paliperidone effect.  Therefore, our only option is hormone replacement therapy (or, last resort, hormonal contraception), however, she would like to avoid this as she would like to have another child.  We did discuss in the past that hypogonadism can result in early osteoporosis. - In this case, I suggested that she sees reproductive endocrinology to see what her options are to have another child.  Without menstrual cycles and in the presence of such a high prolactin, I doubt that she can get  pregnant.  It is possible that she may need IVF.  I advised her to return to see me after she sees the reproductive endocrinologist so we can start HRT/OCPs.  Otherwise, we do not have many options for treatment for now. -She understands and agrees with the plan  Component     Latest Ref Rng & Units 04/15/2018  Prolactin     ng/mL 74.9 (H)  LH     mIU/mL 0.36  Triiodothyronine,Free,Serum     2.3 - 4.2 pg/mL 3.3  T4,Free(Direct)     0.60 - 1.60 ng/dL 0.73  TSH     0.35 - 4.50 uIU/mL 0.78  Cortisol, Plasma     ug/dL 8.6  C206 ACTH     6 - 50 pg/mL 25   PRL: level still high, but much better.  Philemon Kingdom, MD PhD [facility]

## 2018-04-15 NOTE — Patient Instructions (Signed)
Please stop at the lab.  Dr. Marzetta Board at:  [facility] [place]. [place], [place], [address] Phone: [phone] Fax: [phone]  [facility] [place]  [place], [place], [address]. Phone: [phone]  Or  Dr. Lou Miner [address], [place], [place] [address]  [phone]  Please come back to see me after you see the fertility doctor.

## 2018-04-19 ENCOUNTER — Telehealth: Payer: Self-pay

## 2018-04-19 LAB — ACTH: C206 ACTH: 25 pg/mL (ref 6–50)

## 2018-04-19 LAB — INSULIN-LIKE GROWTH FACTOR
IGF-I, LC/MS: 118 ng/mL (ref 52–328)
Z-Score (Female): -0.4 SD (ref ?–2.0)

## 2018-04-19 LAB — PROLACTIN: Prolactin: 74.9 ng/mL — ABNORMAL HIGH

## 2018-04-19 NOTE — Telephone Encounter (Signed)
Notes recorded by Philemon Kingdom, MD on 04/19/2018 at 12:04 PM EDT  Tracy Vaughn, can you please call pt: PRL level is still high, but much better! The rest of the pituitary labs are normal. The hormone that controls estrogen [facility]) is low, as expected in the presence of a high prolactin.  Called patient, no answer and VM not set up

## 2018-04-21 NOTE — Telephone Encounter (Signed)
Read message to patient, she would like to know what this means and what the next step is?  Please advise.

## 2018-04-21 NOTE — Telephone Encounter (Signed)
The prolactin level is the reason why she is not having menstrual cycles (prolactin suppresses her LH level, which is a hormone that controls estrogen). The labs show still high prolactin, but again, this has improved.  She will need to see the fertility doctor to see how she can improve her chance of having children in this particular situation when she cannot stop her psychiatric medication which are causing her high prolactin.

## 2018-04-22 NOTE — Telephone Encounter (Signed)
LM for patient to call back.

## 2018-05-20 ENCOUNTER — Ambulatory Visit (INDEPENDENT_AMBULATORY_CARE_PROVIDER_SITE_OTHER): Payer: Medicaid Other

## 2018-05-20 ENCOUNTER — Other Ambulatory Visit: Payer: Self-pay | Admitting: Podiatry

## 2018-05-20 ENCOUNTER — Encounter: Payer: Self-pay | Admitting: Podiatry

## 2018-05-20 ENCOUNTER — Ambulatory Visit: Payer: Medicaid Other | Admitting: Podiatry

## 2018-05-20 VITALS — BP 101/70 | HR 78 | Resp 16

## 2018-05-20 DIAGNOSIS — M79671 Pain in right foot: Secondary | ICD-10-CM | POA: Diagnosis not present

## 2018-05-20 DIAGNOSIS — M79672 Pain in left foot: Principal | ICD-10-CM

## 2018-05-20 DIAGNOSIS — M722 Plantar fascial fibromatosis: Secondary | ICD-10-CM

## 2018-05-20 MED ORDER — MELOXICAM 15 MG PO TABS: 15.0000 mg | ORAL_TABLET | Freq: Every day | ORAL | 2 refills | Status: DC

## 2018-05-20 MED ORDER — TRIAMCINOLONE ACETONIDE 10 MG/ML IJ SUSP
10.0000 mg | Freq: Once | INTRAMUSCULAR | Status: AC
  Administered 2018-05-20: 10 mg

## 2018-05-20 NOTE — Progress Notes (Signed)
   Subjective:    Patient ID: Tracy Vaughn, female    DOB: Jan 14, 1976, 42 y.o.   MRN: [id number]  HPI    Review of Systems  All other systems reviewed and are negative.      Objective:   Physical Exam        Assessment & Plan:

## 2018-05-20 NOTE — Progress Notes (Signed)
Subjective:   Patient ID: Tracy Vaughn, female   DOB: 42 y.o.   MRN: [id number]   HPI Patient presents stating she is had a lot of problems with her heels over the last 6 months to year and history of cramps of 1 to 2 years duration.  States that it makes it hard for her to be active and she does have low-grade diabetes and needs to be more active.  Patient does not smoke   Review of Systems  All other systems reviewed and are negative.       Objective:  Physical Exam  Constitutional: She appears well-developed and well-nourished.  Cardiovascular: Intact distal pulses.  Pulmonary/Chest: Effort normal.  Musculoskeletal: Normal range of motion.  Neurological: She is alert.  Skin: Skin is warm.  Nursing note and vitals reviewed.   Neurovascular status intact muscle strength is adequate range of motion within normal limits with patient noted to have exquisite discomfort plantar aspect heel bilateral with inflammation fluid around the medial  As it inserts into the calcaneus.  Patient has mild dry skin and no other significant pathology noted with no indications of problems with diabetes     Assessment:  Acute plantar fasciitis of the left and right heel at the insertion calcaneus with moderate depression of the arch     Plan:  H&P x-rays reviewed and today I injected the plantar fascial bilateral 3 mg Kenalog 5 mg Xylocaine advised on shoe gear modifications and arch support and reappoint if symptoms persist  X-rays indicate that there is moderate depression of the arch no indications of significant spurring or arthritis

## 2018-05-20 NOTE — Patient Instructions (Signed)

## 2018-08-31 ENCOUNTER — Other Ambulatory Visit: Payer: Self-pay | Admitting: Podiatry

## 2018-10-14 ENCOUNTER — Emergency Department (HOSPITAL_COMMUNITY)
Admission: EM | Admit: 2018-10-14 | Discharge: 2018-10-14 | Disposition: A | Discharge status: Home / Self Care | Payer: Medicaid Other | Attending: Emergency Medicine | Admitting: Emergency Medicine

## 2018-10-14 ENCOUNTER — Encounter (HOSPITAL_COMMUNITY): Payer: Self-pay | Admitting: Emergency Medicine

## 2018-10-14 ENCOUNTER — Other Ambulatory Visit: Payer: Self-pay

## 2018-10-14 DIAGNOSIS — R5381 Other malaise: Secondary | ICD-10-CM | POA: Diagnosis not present

## 2018-10-14 DIAGNOSIS — E119 Type 2 diabetes mellitus without complications: Secondary | ICD-10-CM | POA: Insufficient documentation

## 2018-10-14 DIAGNOSIS — Z7984 Long term (current) use of oral hypoglycemic drugs: Secondary | ICD-10-CM | POA: Insufficient documentation

## 2018-10-14 DIAGNOSIS — Z79899 Other long term (current) drug therapy: Secondary | ICD-10-CM | POA: Diagnosis not present

## 2018-10-14 DIAGNOSIS — R109 Unspecified abdominal pain: Secondary | ICD-10-CM | POA: Diagnosis present

## 2018-10-14 HISTORY — DX: Type 2 diabetes mellitus without complications: E11.9

## 2018-10-14 LAB — URINALYSIS, ROUTINE W REFLEX MICROSCOPIC
Bilirubin Urine: NEGATIVE
Glucose, UA: NEGATIVE mg/dL
Ketones, ur: NEGATIVE mg/dL
Nitrite: NEGATIVE
Protein, ur: NEGATIVE mg/dL
Specific Gravity, Urine: 1.02 (ref 1.005–1.030)
pH: 6 (ref 5.0–8.0)

## 2018-10-14 LAB — CBC
HEMATOCRIT: 37.9 % (ref 36.0–46.0)
Hemoglobin: 11.3 g/dL — ABNORMAL LOW (ref 12.0–15.0)
MCH: 24.9 pg — ABNORMAL LOW (ref 26.0–34.0)
MCHC: 29.8 g/dL — ABNORMAL LOW (ref 30.0–36.0)
MCV: 83.5 fL (ref 80.0–100.0)
Platelets: 286 10*3/uL (ref 150–400)
RBC: 4.54 MIL/uL (ref 3.87–5.11)
RDW: 14.6 % (ref 11.5–15.5)
WBC: 9.9 10*3/uL (ref 4.0–10.5)
nRBC: 0 % (ref 0.0–0.2)

## 2018-10-14 LAB — COMPREHENSIVE METABOLIC PANEL
ALBUMIN: 3.6 g/dL (ref 3.5–5.0)
ALT: 21 U/L (ref 0–44)
AST: 22 U/L (ref 15–41)
Alkaline Phosphatase: 84 U/L (ref 38–126)
Anion gap: 8 (ref 5–15)
BUN: 7 mg/dL (ref 6–20)
CHLORIDE: 105 mmol/L (ref 98–111)
CO2: 23 mmol/L (ref 22–32)
Calcium: 9.5 mg/dL (ref 8.9–10.3)
Creatinine, Ser: 0.78 mg/dL (ref 0.44–1.00)
GFR calc Af Amer: >60 mL/min (ref >60)
GFR calc non Af Amer: >60 mL/min (ref >60)
Glucose, Bld: 174 mg/dL — ABNORMAL HIGH (ref 70–99)
Potassium: 4 mmol/L (ref 3.5–5.1)
Sodium: 136 mmol/L (ref 135–145)
Total Bilirubin: 0.5 mg/dL (ref 0.3–1.2)
Total Protein: 7.6 g/dL (ref 6.5–8.1)

## 2018-10-14 LAB — I-STAT BETA HCG BLOOD, ED (MC, WL, AP ONLY)

## 2018-10-14 LAB — LIPASE, BLOOD: Lipase: 31 U/L (ref 11–51)

## 2018-10-14 MED ORDER — BENZTROPINE MESYLATE 1 MG PO TABS
2.0000 mg | ORAL_TABLET | Freq: Once | ORAL | Status: AC
  Administered 2018-10-14: 2 mg via ORAL
  Filled 2018-10-14: qty 2

## 2018-10-14 MED ORDER — BENZTROPINE MESYLATE 2 MG PO TABS: 2.0000 mg | ORAL_TABLET | Freq: Every day | ORAL | 0 refills | Status: AC

## 2018-10-14 NOTE — ED Provider Notes (Signed)
[facility] EMERGENCY DEPARTMENT Provider Note   CSN: [id number] Arrival date & time: 10/14/18  1605    History   Chief Complaint Chief Complaint  Patient presents with  . Abdominal Pain  . Emesis    HPI Tracy Vaughn is a 43 y.o. female.     HPI   Patient presents for evaluation of discomfort in her abdomen which she thinks is relating to running out of her benztropine.  She states that she has hard consistency bowel movements, but does not believe that she is constipated.  She denies nausea, vomiting, cough, shortness of breath, fever, chills, weakness or dizziness.  There are no other known modifying factors.  Past Medical History:  Diagnosis Date  . Diabetes mellitus without complication ([place])   . Mental impairment     Patient Active Problem List   Diagnosis Date Noted  . Hyperprolactinemia ([place]) 12/08/2017  . Pituitary microadenoma ([place]) 12/08/2017  . Type 2 diabetes mellitus with complication, without long-term current use of insulin ([place]) 07/30/2017    No past surgical history on file.   OB History   No obstetric history on file.      Home Medications    Prior to Admission medications   Medication Sig Start Date End Date Taking? Authorizing Provider  cariprazine (VRAYLAR) capsule Take 1.5 mg by mouth daily.   Yes [provider]  Cholecalciferol (VITAMIN D3) 5000 units CAPS Take by mouth.   Yes [provider]  gabapentin (NEURONTIN) 300 MG capsule Take 300 mg by mouth 3 (three) times daily.   Yes [provider]  lithium carbonate 300 MG capsule Take 300-600 mg by mouth See admin instructions. Take 1 capsule in the morning and 2 capsule at bedtime.   Yes [provider]  metFORMIN (GLUCOPHAGE) 500 MG tablet Take by mouth 2 (two) times daily with a meal.   Yes [provider]  omeprazole (PRILOSEC) 20 MG capsule Take 20 mg by mouth daily.   Yes [provider]  rosuvastatin (CRESTOR) 40  MG tablet Take 40 mg by mouth daily.   Yes [provider]  venlafaxine XR (EFFEXOR-XR) 75 MG 24 hr capsule Take 75 mg by mouth daily.   Yes [provider]  benztropine (COGENTIN) 2 MG tablet Take 1 tablet (2 mg total) by mouth at bedtime. 10/14/18   Daleen Bo, MD  meloxicam (MOBIC) 15 MG tablet TAKE 1 TABLET(15 MG) BY MOUTH DAILY Patient not taking: Reported on 10/14/2018 08/31/18   Wallene Huh, DPM    Family History No family history on file.  Social History Social History   Tobacco Use  . Smoking status: Never Smoker  . Smokeless tobacco: Never Used  Substance Use Topics  . Alcohol use: No  . Drug use: Not on file     Allergies   Fanapt [iloperidone]; Lithium; and Novocain [procaine]   Review of Systems Review of Systems  All other systems reviewed and are negative.    Physical Exam Updated Vital Signs BP (!) 142/95   Pulse 81   Temp 98.1 F (36.7 C) (Oral)   Resp 20   Ht 5\' 6"  (1.676 m)   Wt 134.7 kg   SpO2 98%   BMI 47.94 kg/m   Physical Exam Vitals signs and nursing note reviewed.  Constitutional:      General: She is not in acute distress.    Appearance: She is well-developed. She is obese. She is not ill-appearing or diaphoretic.  HENT:  Head: Normocephalic and atraumatic.     Right Ear: External ear normal.     Left Ear: External ear normal.  Eyes:     Conjunctiva/sclera: Conjunctivae normal.     Pupils: Pupils are equal, round, and reactive to light.  Neck:     Musculoskeletal: Normal range of motion and neck supple.     Trachea: Phonation normal.  Cardiovascular:     Rate and Rhythm: Normal rate and regular rhythm.     Heart sounds: Normal heart sounds.  Pulmonary:     Effort: Pulmonary effort is normal.     Breath sounds: Normal breath sounds.  Abdominal:     General: There is no distension.     Palpations: Abdomen is soft. There is no mass.     Tenderness: There is no abdominal tenderness. There is no  guarding.     Hernia: No hernia is present.  Musculoskeletal: Normal range of motion.  Skin:    General: Skin is warm and dry.  Neurological:     Mental Status: She is alert and oriented to person, place, and time.     Cranial Nerves: No cranial nerve deficit.     Sensory: No sensory deficit.     Motor: No abnormal muscle tone.     Coordination: Coordination normal.  Psychiatric:        Mood and Affect: Mood normal.        Behavior: Behavior normal.        Thought Content: Thought content normal.      ED Treatments / Results  Labs (all labs ordered are listed, but only abnormal results are displayed) Labs Reviewed  COMPREHENSIVE METABOLIC PANEL - Abnormal; Notable for the following components:      Result Value   Glucose, Bld 174 (*)    All other components within normal limits  CBC - Abnormal; Notable for the following components:   Hemoglobin 11.3 (*)    MCH 24.9 (*)    MCHC 29.8 (*)    All other components within normal limits  URINALYSIS, ROUTINE W REFLEX MICROSCOPIC - Abnormal; Notable for the following components:   APPearance CLOUDY (*)    Hgb urine dipstick SMALL (*)    Leukocytes,Ua LARGE (*)    Bacteria, UA FEW (*)    All other components within normal limits  LIPASE, BLOOD  I-STAT BETA HCG BLOOD, ED ([facility], [facility], [facility] ONLY)    EKG None  Radiology No results found.  Procedures Procedures (including critical care time)  Medications Ordered in ED Medications  benztropine (COGENTIN) tablet 2 mg (2 mg Oral Given 10/14/18 2226)     Initial Impression / Assessment and Plan / ED Course  I have reviewed the triage vital signs and the nursing notes.  Pertinent labs & imaging results that were available during my care of the patient were reviewed by me and considered in my medical decision making (see chart for details).  Clinical Course as of Oct 17 1143  Sun Oct 17, 2018  1144 Normal  I-Stat beta hCG blood, ED [EW]  1144 Normal  Lipase, blood [EW]  1144  Normal  CBC(!) [EW]  1144 Normal except glucose elevated  Comprehensive metabolic panel(!) [EW]  1740 Normal except elevated leukocytes with few bacteria and increased squamous epithelials indicating contamination.  Urinalysis, Routine w reflex microscopic(!) [EW]    Clinical Course User Index [EW] Daleen Bo, MD        No data found.  11:45 AM Reevaluation with update  and discussion. After initial assessment and treatment, an updated evaluation reveals patient remains comfortable.  She again relates that the only problem she is having is being out of her benztropine.  She was given a dose here and a prescription.  She will follow-up with her psychiatrist at [facility].Daleen Bo   Medical Decision Making: [place] abdominal pain with reassuring evaluation.  Doubt acute inflammatory process, intra-abdominal infection, or metabolic instability.  There are no signs of lithium toxicity.  CRITICAL CARE-no Performed by: Daleen Bo  Nursing Notes Reviewed/ Care Coordinated Applicable Imaging Reviewed Interpretation of Laboratory Data incorporated into ED treatment  The patient appears reasonably screened and/or stabilized for discharge and I doubt any other medical condition or other [facility] requiring further screening, evaluation, or treatment in the ED at this time prior to discharge.  Plan: Home Medications-continue usual; Home Treatments-rest, fluids; return here if the recommended treatment, does not improve the symptoms; Recommended follow up-psychiatry, PRN   Final Clinical Impressions(s) / ED Diagnoses   Final diagnoses:  Malaise    ED Discharge Orders         Ordered    benztropine (COGENTIN) 2 MG tablet  Daily at bedtime     10/14/18 2234           Daleen Bo, MD 10/17/18 1145

## 2018-10-14 NOTE — ED Triage Notes (Signed)
Pt reports abdominal pain since 2/29. Pt reports N/V, unable to eat or sleep. Pt denies changes in urination. Pt reports constipation, last BM yesterday reports stool was hard.

## 2018-10-14 NOTE — Discharge Instructions (Addendum)
See your doctor as needed for problems 

## 2018-10-14 NOTE — ED Notes (Signed)
Patient verbalizes understanding of discharge instructions. Opportunity for questioning and answers were provided. Armband removed by staff, pt discharged from ED.  

## 2018-12-02 ENCOUNTER — Other Ambulatory Visit: Payer: Self-pay | Admitting: Podiatry

## 2019-02-24 ENCOUNTER — Other Ambulatory Visit: Payer: Self-pay | Admitting: Internal Medicine

## 2019-02-24 DIAGNOSIS — Z1231 Encounter for screening mammogram for malignant neoplasm of breast: Secondary | ICD-10-CM

## 2019-03-24 ENCOUNTER — Other Ambulatory Visit: Payer: Self-pay | Admitting: Podiatry

## 2019-03-29 ENCOUNTER — Emergency Department (HOSPITAL_COMMUNITY)
Admission: EM | Admit: 2019-03-29 | Discharge: 2019-03-29 | Disposition: A | Discharge status: Home / Self Care | Payer: Medicaid Other | Attending: Emergency Medicine | Admitting: Emergency Medicine

## 2019-03-29 ENCOUNTER — Encounter (HOSPITAL_COMMUNITY): Payer: Self-pay | Admitting: Emergency Medicine

## 2019-03-29 ENCOUNTER — Other Ambulatory Visit: Payer: Self-pay

## 2019-03-29 ENCOUNTER — Emergency Department (HOSPITAL_COMMUNITY): Payer: Medicaid Other

## 2019-03-29 DIAGNOSIS — Z79899 Other long term (current) drug therapy: Secondary | ICD-10-CM | POA: Insufficient documentation

## 2019-03-29 DIAGNOSIS — Z7984 Long term (current) use of oral hypoglycemic drugs: Secondary | ICD-10-CM | POA: Insufficient documentation

## 2019-03-29 DIAGNOSIS — R51 Headache: Secondary | ICD-10-CM | POA: Insufficient documentation

## 2019-03-29 DIAGNOSIS — F259 Schizoaffective disorder, unspecified: Secondary | ICD-10-CM | POA: Insufficient documentation

## 2019-03-29 DIAGNOSIS — R350 Frequency of micturition: Secondary | ICD-10-CM | POA: Insufficient documentation

## 2019-03-29 DIAGNOSIS — R739 Hyperglycemia, unspecified: Secondary | ICD-10-CM

## 2019-03-29 DIAGNOSIS — E1165 Type 2 diabetes mellitus with hyperglycemia: Secondary | ICD-10-CM | POA: Insufficient documentation

## 2019-03-29 DIAGNOSIS — R55 Syncope and collapse: Secondary | ICD-10-CM | POA: Diagnosis present

## 2019-03-29 DIAGNOSIS — I951 Orthostatic hypotension: Secondary | ICD-10-CM | POA: Insufficient documentation

## 2019-03-29 HISTORY — DX: Schizoaffective disorder, unspecified: F25.9

## 2019-03-29 LAB — COMPREHENSIVE METABOLIC PANEL
ALT: 25 U/L (ref 0–44)
AST: 26 U/L (ref 15–41)
Albumin: 3.4 g/dL — ABNORMAL LOW (ref 3.5–5.0)
Alkaline Phosphatase: 92 U/L (ref 38–126)
Anion gap: 9 (ref 5–15)
BUN: <5 mg/dL — ABNORMAL LOW (ref 6–20)
CO2: 22 mmol/L (ref 22–32)
Calcium: 9 mg/dL (ref 8.9–10.3)
Chloride: 102 mmol/L (ref 98–111)
Creatinine, Ser: 0.92 mg/dL (ref 0.44–1.00)
GFR calc Af Amer: >60 mL/min (ref >60)
GFR calc non Af Amer: >60 mL/min (ref >60)
Glucose, Bld: 329 mg/dL — ABNORMAL HIGH (ref 70–99)
Potassium: 3.9 mmol/L (ref 3.5–5.1)
Sodium: 133 mmol/L — ABNORMAL LOW (ref 135–145)
Total Bilirubin: 0.2 mg/dL — ABNORMAL LOW (ref 0.3–1.2)
Total Protein: 7.3 g/dL (ref 6.5–8.1)

## 2019-03-29 LAB — CBC WITH DIFFERENTIAL/PLATELET
Abs Immature Granulocytes: 0.03 10*3/uL (ref 0.00–0.07)
Basophils Absolute: 0 10*3/uL (ref 0.0–0.1)
Basophils Relative: 0 %
Eosinophils Absolute: 0.2 10*3/uL (ref 0.0–0.5)
Eosinophils Relative: 1 %
HCT: 37.1 % (ref 36.0–46.0)
Hemoglobin: 11.2 g/dL — ABNORMAL LOW (ref 12.0–15.0)
Immature Granulocytes: 0 %
Lymphocytes Relative: 30 %
Lymphs Abs: 3.2 10*3/uL (ref 0.7–4.0)
MCH: 25.2 pg — ABNORMAL LOW (ref 26.0–34.0)
MCHC: 30.2 g/dL (ref 30.0–36.0)
MCV: 83.4 fL (ref 80.0–100.0)
Monocytes Absolute: 0.6 10*3/uL (ref 0.1–1.0)
Monocytes Relative: 6 %
Neutro Abs: 6.6 10*3/uL (ref 1.7–7.7)
Neutrophils Relative %: 63 %
Platelets: 285 10*3/uL (ref 150–400)
RBC: 4.45 MIL/uL (ref 3.87–5.11)
RDW: 14.3 % (ref 11.5–15.5)
WBC: 10.6 10*3/uL — ABNORMAL HIGH (ref 4.0–10.5)
nRBC: 0 % (ref 0.0–0.2)

## 2019-03-29 LAB — I-STAT BETA HCG BLOOD, ED (MC, WL, AP ONLY): I-stat hCG, quantitative: <5 m[IU]/mL (ref <5)

## 2019-03-29 LAB — CBG MONITORING, ED: Glucose-Capillary: 313 mg/dL — ABNORMAL HIGH (ref 70–99)

## 2019-03-29 MED ORDER — SODIUM CHLORIDE 0.9 % IV SOLN: INTRAVENOUS | Status: DC

## 2019-03-29 MED ORDER — SODIUM CHLORIDE 0.9 % IV BOLUS
1000.0000 mL | Freq: Once | INTRAVENOUS | Status: AC
  Administered 2019-03-29: 1000 mL via INTRAVENOUS

## 2019-03-29 NOTE — ED Triage Notes (Signed)
Pt to ED via [facility] after reported having a syncope episode while at work. Pt st's she was dizzy yesterday.  Pt did strike the back of her head  EMS also reports pt has been taking Benztropine but has been of it work 3 days

## 2019-03-29 NOTE — ED Provider Notes (Signed)
[facility] EMERGENCY DEPARTMENT Provider Note   CSN: [id number] Arrival date & time: 03/29/19  1707     History   Chief Complaint Chief Complaint  Patient presents with  . Loss of Consciousness    HPI Tracy Vaughn is a 43 y.o. female who presents emergency department with chief complaint of syncope.  She has a past medical history of schizoaffective disorder and diabetes.  Patient states that she ran out of her benztropine and has not had it for the past 3 days.  She states that she always gets dizzy and lightheaded whenever she does not have her medicine but she has never had a syncopal event until today.  Patient has also noted that her blood sugars have been elevated despite taking her metformin.  She has had increased thirst and urination is very frequent.  She states she is up and down all night urinating.  She denies urinary urgency or dysuria.  Patient denies any upper extremity numbness or tingling.  She said that when she was at work today she started feeling very dizzy and then she fell backward and was unable to stop herself.  She only lost consciousness briefly.  She has a mild headache.  She denies any vision changes, extremity weakness.     HPI  Past Medical History:  Diagnosis Date  . Diabetes mellitus without complication ([place])   . Mental impairment   . Schizoaffective disorder [facility])     Patient Active Problem List   Diagnosis Date Noted  . Hyperprolactinemia ([place]) 12/08/2017  . Pituitary microadenoma ([place]) 12/08/2017  . Type 2 diabetes mellitus with complication, without long-term current use of insulin ([place]) 07/30/2017    History reviewed. No pertinent surgical history.   OB History   No obstetric history on file.      Home Medications    Prior to Admission medications   Medication Sig Start Date End Date Taking? Authorizing Provider  benztropine (COGENTIN) 2 MG tablet Take 1 tablet (2 mg total) by mouth at bedtime. 10/14/18   Daleen Bo, MD  cariprazine (VRAYLAR) capsule Take 1.5 mg by mouth daily.    [provider]  Cholecalciferol (VITAMIN D3) 5000 units CAPS Take by mouth.    [provider]  gabapentin (NEURONTIN) 300 MG capsule Take 300 mg by mouth 3 (three) times daily.    [provider]  lithium carbonate 300 MG capsule Take 300-600 mg by mouth See admin instructions. Take 1 capsule in the morning and 2 capsule at bedtime.    [provider]  meloxicam (MOBIC) 15 MG tablet TAKE 1 TABLET(15 MG) BY MOUTH DAILY 12/03/18   Regal, Tamala Fothergill, DPM  metFORMIN (GLUCOPHAGE) 500 MG tablet Take by mouth 2 (two) times daily with a meal.    [provider]  omeprazole (PRILOSEC) 20 MG capsule Take 20 mg by mouth daily.    [provider]  rosuvastatin (CRESTOR) 40 MG tablet Take 40 mg by mouth daily.    [provider]  venlafaxine XR (EFFEXOR-XR) 75 MG 24 hr capsule Take 75 mg by mouth daily.    [provider]    Family History No family history on file.  Social History Social History   Tobacco Use  . Smoking status: Never Smoker  . Smokeless tobacco: Never Used  Substance Use Topics  . Alcohol use: No  . Drug use: Not on file     Allergies   Fanapt [iloperidone], Lithium, and Novocain [procaine]  Review of Systems Review of Systems  Ten systems reviewed and are negative for acute change, except as noted in the HPI.   Physical Exam Updated Vital Signs BP (!) 145/84   Pulse 83   Temp 98.5 F (36.9 C) (Oral)   Resp (!) 21   Ht 5\' 6"  (1.676 m)   Wt 136.1 kg   SpO2 100%   BMI 48.42 kg/m   Physical Exam Vitals signs and nursing note reviewed.  Constitutional:      General: She is not in acute distress.    Appearance: She is well-developed. She is not diaphoretic.  HENT:     Head: Normocephalic and atraumatic.  Eyes:     General: No scleral icterus.    Conjunctiva/sclera: Conjunctivae normal.  Neck:     Musculoskeletal:  Normal range of motion.  Cardiovascular:     Rate and Rhythm: Normal rate and regular rhythm.     Heart sounds: Normal heart sounds. No murmur. No friction rub. No gallop.   Pulmonary:     Effort: Pulmonary effort is normal. No respiratory distress.     Breath sounds: Normal breath sounds.  Abdominal:     General: Bowel sounds are normal. There is no distension.     Palpations: Abdomen is soft. There is no mass.     Tenderness: There is no abdominal tenderness. There is no guarding.  Skin:    General: Skin is warm and dry.  Neurological:     Mental Status: She is alert and oriented to person, place, and time.     Comments: Speech is clear and goal oriented, follows commands Major Cranial nerves without deficit, no facial droop Normal strength in upper and lower extremities bilaterally including dorsiflexion and plantar flexion, strong and equal grip strength Sensation normal to light and sharp touch Moves extremities without ataxia, coordination intact Normal finger to nose and rapid alternating movements  no pronator drift Normal heel-shin   Psychiatric:        Behavior: Behavior normal.      ED Treatments / Results  Labs (all labs ordered are listed, but only abnormal results are displayed) Labs Reviewed  CBC WITH DIFFERENTIAL/PLATELET - Abnormal; Notable for the following components:      Result Value   WBC 10.6 (*)    Hemoglobin 11.2 (*)    MCH 25.2 (*)    All other components within normal limits  COMPREHENSIVE METABOLIC PANEL - Abnormal; Notable for the following components:   Sodium 133 (*)    Glucose, Bld 329 (*)    BUN <5 (*)    Albumin 3.4 (*)    Total Bilirubin 0.2 (*)    All other components within normal limits  CBG MONITORING, ED - Abnormal; Notable for the following components:   Glucose-Capillary 313 (*)    All other components within normal limits  CBG MONITORING, ED  I-STAT BETA HCG BLOOD, ED ([facility], [facility], [facility] ONLY)    EKG EKG Interpretation   Date/Time:  Tuesday March 29 2019 18:20:33 EDT Ventricular Rate:  73 PR Interval:    QRS Duration: 95 QT Interval:  393 QTC Calculation: 433 R Axis:   -7 Text Interpretation:  Sinus rhythm Abnormal R-wave progression, early transition Probable left ventricular hypertrophy T wave abnormality Abnormal ekg Confirmed by Carmin Muskrat [id number]) on 03/29/2019 6:29:41 PM   Radiology No results found.  Procedures Procedures (including critical care time)  Medications Ordered in ED Medications  sodium chloride 0.9 % bolus 1,000 mL (0  mLs Intravenous Stopped 03/29/19 2133)  sodium chloride 0.9 % bolus 1,000 mL (0 mLs Intravenous Stopped 03/29/19 2133)     Initial Impression / Assessment and Plan / ED Course  I have reviewed the triage vital signs and the nursing notes.  Pertinent labs & imaging results that were available during my care of the patient were reviewed by me and considered in my medical decision making (see chart for details).        [id number] VS: BP (!) 145/84   Pulse 83   Temp 98.5 F (36.9 C) (Oral)   Resp (!) 21   Ht 5\' 6"  (1.676 m)   Wt 136.1 kg   SpO2 100%   BMI 48.42 kg/m  [id number] is gathered by patient and emr. DDX:The differential for syncope is extensive and includes, but is not limited to: arrythmia (Vtach, SVT, SSS, sinus arrest, AV block, bradycardia) aortic stenosis, AMI, HOCM, PE, atrial myxoma, pulmonary hypertension, orthostatic hypotension, (hypovolemia, drug effect, GB syndrome, micturition, cough, swall) carotid sinus sensitivity, Seizure, TIA/CVA, hypoglycemia,  Vertigo. Labs: I reviewed the labs which show hyperglycemia, hyponatremia in the setting of elevated blood glucose, slightly elevated white count, normocytic anemia likely due to chronic disease. Imaging: I personally reviewed the images (CT head and C-spine) which show(s) negative for acute abnormality EKG:  EKG Interpretation  Date/Time:  Tuesday March 29 2019 18:20:33 EDT  Ventricular Rate:  73 PR Interval:    QRS Duration: 95 QT Interval:  393 QTC Calculation: 433 R Axis:   -7 Text Interpretation:  Sinus rhythm Abnormal R-wave progression, early transition Probable left ventricular hypertrophy T wave abnormality Abnormal ekg Confirmed by Carmin Muskrat [id number]) on 03/29/2019 6:29:41 PM      MDM: Patient here with syncopal event, positive orthostatic vital signs.  Given fluids with significant improvement in her symptoms.  Think this is likely secondary to her chronically elevated blood glucose and secondary diuresis.  She does admit to polyuria and polydipsia.  Discussed that she will need to resume her medications and follow closely with her PCP regarding her elevated sugars.  Continue taking her medications as directed.  I doubt any other emergent cause of her syncopal event today.  She appears otherwise appropriate for discharge. Patient disposition: Discharge Patient condition: Good. The patient appears reasonably screened and/or stabilized for discharge and I doubt any other medical condition or other [facility] requiring further screening, evaluation, or treatment in the ED at this time prior to discharge. I have discussed lab and/or imaging findings with the patient and answered all questions/concerns to the best of my ability. I have discussed return precautions and OP follow up.      Final Clinical Impressions(s) / ED Diagnoses   Final diagnoses:  Orthostatic syncope  Hyperglycemia    ED Discharge Orders    None       Margarita Mail, PA-C 04/01/19 0934    Carmin Muskrat, MD 04/02/19 2212

## 2019-03-29 NOTE — ED Notes (Signed)
CBG Results of 313 Reported to Maudie Mercury, Therapist, sports.

## 2019-03-29 NOTE — Discharge Instructions (Signed)
Get help right away if you: Have chest pain. Have a fast or irregular heartbeat. Develop numbness in any part of your body. Cannot move your arms or your legs. Have trouble speaking. Become sweaty or feel light-headed. Faint. Feel short of breath. Have trouble staying awake. Feel confused. 

## 2019-03-29 NOTE — ED Notes (Signed)
Lying  B/P 134/81  P 71 Sitting  B/P  138/91  P 74 Standing  B/P  119/98  P 90

## 2019-04-13 ENCOUNTER — Ambulatory Visit: Payer: Medicaid Other

## 2019-05-24 ENCOUNTER — Ambulatory Visit: Payer: Medicaid Other

## 2019-05-26 ENCOUNTER — Ambulatory Visit: Payer: Medicaid Other

## 2019-05-31 ENCOUNTER — Ambulatory Visit: Payer: Medicaid Other

## 2019-11-28 ENCOUNTER — Other Ambulatory Visit: Payer: Self-pay

## 2019-11-28 ENCOUNTER — Emergency Department (HOSPITAL_COMMUNITY): Payer: Medicaid Other

## 2019-11-28 ENCOUNTER — Inpatient Hospital Stay (HOSPITAL_COMMUNITY)
Admission: EM | Admit: 2019-11-28 | Discharge: 2019-12-01 | DRG: 177 | Disposition: A | Discharge status: Home / Self Care | Payer: Medicaid Other | Attending: Internal Medicine | Admitting: Internal Medicine

## 2019-11-28 ENCOUNTER — Encounter (HOSPITAL_COMMUNITY): Payer: Self-pay | Admitting: Emergency Medicine

## 2019-11-28 DIAGNOSIS — R1110 Vomiting, unspecified: Secondary | ICD-10-CM

## 2019-11-28 DIAGNOSIS — I10 Essential (primary) hypertension: Secondary | ICD-10-CM | POA: Diagnosis present

## 2019-11-28 DIAGNOSIS — E669 Obesity, unspecified: Secondary | ICD-10-CM | POA: Diagnosis present

## 2019-11-28 DIAGNOSIS — R0602 Shortness of breath: Secondary | ICD-10-CM

## 2019-11-28 DIAGNOSIS — Z79899 Other long term (current) drug therapy: Secondary | ICD-10-CM

## 2019-11-28 DIAGNOSIS — U071 COVID-19: Principal | ICD-10-CM | POA: Diagnosis present

## 2019-11-28 DIAGNOSIS — E162 Hypoglycemia, unspecified: Secondary | ICD-10-CM | POA: Diagnosis present

## 2019-11-28 DIAGNOSIS — E11649 Type 2 diabetes mellitus with hypoglycemia without coma: Secondary | ICD-10-CM | POA: Diagnosis present

## 2019-11-28 DIAGNOSIS — J1282 Pneumonia due to coronavirus disease 2019: Secondary | ICD-10-CM | POA: Diagnosis present

## 2019-11-28 DIAGNOSIS — I959 Hypotension, unspecified: Secondary | ICD-10-CM | POA: Diagnosis present

## 2019-11-28 DIAGNOSIS — F259 Schizoaffective disorder, unspecified: Secondary | ICD-10-CM | POA: Diagnosis present

## 2019-11-28 DIAGNOSIS — Z6841 Body Mass Index (BMI) 40.0 and over, adult: Secondary | ICD-10-CM

## 2019-11-28 DIAGNOSIS — Z7984 Long term (current) use of oral hypoglycemic drugs: Secondary | ICD-10-CM

## 2019-11-28 DIAGNOSIS — E118 Type 2 diabetes mellitus with unspecified complications: Secondary | ICD-10-CM | POA: Diagnosis present

## 2019-11-28 DIAGNOSIS — R197 Diarrhea, unspecified: Secondary | ICD-10-CM

## 2019-11-28 LAB — COMPREHENSIVE METABOLIC PANEL
ALT: 34 U/L (ref 0–44)
AST: 46 U/L — ABNORMAL HIGH (ref 15–41)
Albumin: 2.8 g/dL — ABNORMAL LOW (ref 3.5–5.0)
Alkaline Phosphatase: 44 U/L (ref 38–126)
Anion gap: 10 (ref 5–15)
BUN: 8 mg/dL (ref 6–20)
CO2: 21 mmol/L — ABNORMAL LOW (ref 22–32)
Calcium: 8.8 mg/dL — ABNORMAL LOW (ref 8.9–10.3)
Chloride: 102 mmol/L (ref 98–111)
Creatinine, Ser: 1.08 mg/dL — ABNORMAL HIGH (ref 0.44–1.00)
GFR calc Af Amer: >60 mL/min (ref >60)
GFR calc non Af Amer: >60 mL/min (ref >60)
Glucose, Bld: 55 mg/dL — ABNORMAL LOW (ref 70–99)
Potassium: 3.4 mmol/L — ABNORMAL LOW (ref 3.5–5.1)
Sodium: 133 mmol/L — ABNORMAL LOW (ref 135–145)
Total Bilirubin: 0.4 mg/dL (ref 0.3–1.2)
Total Protein: 7.7 g/dL (ref 6.5–8.1)

## 2019-11-28 LAB — URINALYSIS, ROUTINE W REFLEX MICROSCOPIC
Bilirubin Urine: NEGATIVE
Glucose, UA: NEGATIVE mg/dL
Hgb urine dipstick: NEGATIVE
Ketones, ur: NEGATIVE mg/dL
Leukocytes,Ua: NEGATIVE
Nitrite: NEGATIVE
Protein, ur: NEGATIVE mg/dL
Specific Gravity, Urine: 1.004 — ABNORMAL LOW (ref 1.005–1.030)
pH: 8 (ref 5.0–8.0)

## 2019-11-28 LAB — CBG MONITORING, ED
Glucose-Capillary: 123 mg/dL — ABNORMAL HIGH (ref 70–99)
Glucose-Capillary: 33 mg/dL — CL (ref 70–99)

## 2019-11-28 LAB — CBC WITH DIFFERENTIAL/PLATELET
Abs Immature Granulocytes: 0.03 10*3/uL (ref 0.00–0.07)
Basophils Absolute: 0 10*3/uL (ref 0.0–0.1)
Basophils Relative: 0 %
Eosinophils Absolute: 0 10*3/uL (ref 0.0–0.5)
Eosinophils Relative: 0 %
HCT: 34.8 % — ABNORMAL LOW (ref 36.0–46.0)
Hemoglobin: 10.2 g/dL — ABNORMAL LOW (ref 12.0–15.0)
Immature Granulocytes: 0 %
Lymphocytes Relative: 20 %
Lymphs Abs: 1.4 10*3/uL (ref 0.7–4.0)
MCH: 24.6 pg — ABNORMAL LOW (ref 26.0–34.0)
MCHC: 29.3 g/dL — ABNORMAL LOW (ref 30.0–36.0)
MCV: 83.9 fL (ref 80.0–100.0)
Monocytes Absolute: 0.3 10*3/uL (ref 0.1–1.0)
Monocytes Relative: 5 %
Neutro Abs: 5.2 10*3/uL (ref 1.7–7.7)
Neutrophils Relative %: 75 %
Platelets: 247 10*3/uL (ref 150–400)
RBC: 4.15 MIL/uL (ref 3.87–5.11)
RDW: 15.4 % (ref 11.5–15.5)
WBC: 7 10*3/uL (ref 4.0–10.5)
nRBC: 0 % (ref 0.0–0.2)

## 2019-11-28 LAB — RESPIRATORY PANEL BY RT PCR (FLU A&B, COVID)
Influenza A by PCR: NEGATIVE
Influenza B by PCR: NEGATIVE
SARS Coronavirus 2 by RT PCR: POSITIVE — AB

## 2019-11-28 LAB — LACTIC ACID, PLASMA: Lactic Acid, Venous: 1.4 mmol/L (ref 0.5–1.9)

## 2019-11-28 LAB — PROTIME-INR
INR: 1 (ref 0.8–1.2)
Prothrombin Time: 13 seconds (ref 11.4–15.2)

## 2019-11-28 LAB — PHOSPHORUS: Phosphorus: 2.9 mg/dL (ref 2.5–4.6)

## 2019-11-28 LAB — MAGNESIUM: Magnesium: 1.9 mg/dL (ref 1.7–2.4)

## 2019-11-28 LAB — TSH: TSH: 0.908 u[IU]/mL (ref 0.350–4.500)

## 2019-11-28 LAB — GROUP A STREP BY PCR: Group A Strep by PCR: NOT DETECTED

## 2019-11-28 LAB — I-STAT BETA HCG BLOOD, ED (MC, WL, AP ONLY): I-stat hCG, quantitative: <5 m[IU]/mL (ref <5)

## 2019-11-28 LAB — LIPASE, BLOOD: Lipase: 29 U/L (ref 11–51)

## 2019-11-28 MED ORDER — LITHIUM CARBONATE 300 MG PO CAPS
300.0000 mg | ORAL_CAPSULE | Freq: Every day | ORAL | Status: DC
  Administered 2019-11-29: 10:00:00 300 mg via ORAL
  Filled 2019-11-28 (×2): qty 1

## 2019-11-28 MED ORDER — SODIUM CHLORIDE 0.9 % IV SOLN: 100.0000 mg | Freq: Every day | INTRAVENOUS | Status: DC

## 2019-11-28 MED ORDER — LITHIUM CARBONATE 300 MG PO CAPS
600.0000 mg | ORAL_CAPSULE | Freq: Every day | ORAL | Status: DC
  Administered 2019-11-29 – 2019-11-30 (×3): 600 mg via ORAL
  Filled 2019-11-28 (×5): qty 2

## 2019-11-28 MED ORDER — DEXTROSE 50 % IV SOLN
INTRAVENOUS | Status: AC
  Filled 2019-11-28: qty 50

## 2019-11-28 MED ORDER — BENZTROPINE MESYLATE 1 MG PO TABS
2.0000 mg | ORAL_TABLET | Freq: Every day | ORAL | Status: DC
  Administered 2019-11-29 – 2019-11-30 (×3): 2 mg via ORAL
  Filled 2019-11-28 (×3): qty 2

## 2019-11-28 MED ORDER — SODIUM CHLORIDE 0.9 % IV BOLUS (SEPSIS): 1000.0000 mL | Freq: Once | INTRAVENOUS | Status: DC

## 2019-11-28 MED ORDER — CARIPRAZINE HCL 1.5 MG PO CAPS
1.5000 mg | ORAL_CAPSULE | Freq: Every day | ORAL | Status: DC
  Administered 2019-12-01: 09:00:00 1.5 mg via ORAL
  Filled 2019-11-28 (×3): qty 1

## 2019-11-28 MED ORDER — DEXTROSE-NACL 5-0.45 % IV SOLN: INTRAVENOUS | Status: DC

## 2019-11-28 MED ORDER — SODIUM CHLORIDE 0.9 % IV SOLN: 200.0000 mg | Freq: Once | INTRAVENOUS | Status: DC

## 2019-11-28 MED ORDER — LITHIUM CARBONATE 300 MG PO CAPS: 300.0000 mg | ORAL_CAPSULE | ORAL | Status: DC

## 2019-11-28 MED ORDER — VENLAFAXINE HCL ER 75 MG PO CP24
75.0000 mg | ORAL_CAPSULE | Freq: Every day | ORAL | Status: DC
  Administered 2019-11-30 – 2019-12-01 (×2): 75 mg via ORAL
  Filled 2019-11-28 (×2): qty 1

## 2019-11-28 MED ORDER — ACETAMINOPHEN 325 MG PO TABS
650.0000 mg | ORAL_TABLET | Freq: Four times a day (QID) | ORAL | Status: DC | PRN
  Administered 2019-11-29: 05:00:00 650 mg via ORAL
  Filled 2019-11-28: qty 2

## 2019-11-28 MED ORDER — SODIUM CHLORIDE 0.9 % IV SOLN
100.0000 mg | Freq: Every day | INTRAVENOUS | Status: DC
  Administered 2019-11-30 – 2019-12-01 (×2): 100 mg via INTRAVENOUS
  Filled 2019-11-28 (×2): qty 20

## 2019-11-28 MED ORDER — SODIUM CHLORIDE 0.9 % IV SOLN: Freq: Once | INTRAVENOUS | Status: AC

## 2019-11-28 MED ORDER — PANTOPRAZOLE SODIUM 40 MG PO TBEC
40.0000 mg | DELAYED_RELEASE_TABLET | Freq: Every day | ORAL | Status: DC
  Administered 2019-11-29 – 2019-12-01 (×3): 40 mg via ORAL
  Filled 2019-11-28 (×3): qty 1

## 2019-11-28 MED ORDER — HYDROCOD POLST-CPM POLST ER 10-8 MG/5ML PO SUER: 5.0000 mL | Freq: Two times a day (BID) | ORAL | Status: DC | PRN

## 2019-11-28 MED ORDER — DEXTROSE-NACL 5-0.9 % IV SOLN: INTRAVENOUS | Status: DC

## 2019-11-28 MED ORDER — GABAPENTIN 300 MG PO CAPS
600.0000 mg | ORAL_CAPSULE | Freq: Two times a day (BID) | ORAL | Status: DC
  Administered 2019-11-29 – 2019-12-01 (×5): 600 mg via ORAL
  Filled 2019-11-28 (×6): qty 2

## 2019-11-28 MED ORDER — SODIUM CHLORIDE 0.9 % IV SOLN: 1000.0000 mL | INTRAVENOUS | Status: DC

## 2019-11-28 MED ORDER — GUAIFENESIN-DM 100-10 MG/5ML PO SYRP: 10.0000 mL | ORAL_SOLUTION | ORAL | Status: DC | PRN

## 2019-11-28 MED ORDER — SODIUM CHLORIDE 0.9 % IV SOLN
200.0000 mg | Freq: Once | INTRAVENOUS | Status: AC
  Administered 2019-11-29: 200 mg via INTRAVENOUS
  Filled 2019-11-28 (×2): qty 40

## 2019-11-28 NOTE — ED Triage Notes (Signed)
Pt BIB [facility] from home. Pt complaint of weakness x10 days. Upon EMS arrival pt hypoglycemic, hypotensive, and tachycardic. Pt initial CBG 41 received a total of 25 g d10 via EMS. VSS. NAD.

## 2019-11-28 NOTE — ED Notes (Signed)
CBG Results of 33 reported to [place], Therapist, sports.

## 2019-11-28 NOTE — ED Provider Notes (Addendum)
[name] Provider Note   CSN: [id number] Arrival date & time: 11/28/19  1836     History Chief Complaint  Patient presents with  . Hypoglycemia  . Hypotension  . Tachycardia    Tracy Vaughn is a 44 y.o. female.  HPI Patient reports she started getting sick for over a week.  Reports she has had a lot of generalized body aches and fatigue.  She does not have a thermometer to measure her temperature.  She does not know if she had a fever.  She reports she has had coughing but has not felt short of breath or that she has chest pain.  She reports she has had several episodes of vomiting.  She reports she is able to eat small amounts of food without vomiting.  She reports she has had also diarrhea for over 3 days.  Less than 6 episodes per day.  She reports she has some crampy achy discomfort in her lower abdomen.  Patient endorses sore throat but denies significant nasal congestion.  Patient denies that she has had Covid contact that she is aware of or that she has had a positive Covid test.  No swelling of the extremities or rashes.  Patient reports she has not been sexually active in over 5 years.  Patient was transported by EMS.  MS reports patient was hypoglycemic with CBG of 41, she was given 25 g of dextrose.  Per their report patient also was hypotensive and tachycardic.    Past Medical History:  Diagnosis Date  . Diabetes mellitus without complication ([place])   . Mental impairment   . Schizoaffective disorder [facility])     Patient Active Problem List   Diagnosis Date Noted  . Schizoaffective disorder ([place]) 11/28/2019  . Hypoglycemia 11/28/2019  . Hyperprolactinemia ([place]) 12/08/2017  . Pituitary microadenoma ([place]) 12/08/2017  . Type 2 diabetes mellitus with complication, without long-term current use of insulin ([place]) 07/30/2017    History reviewed. No pertinent surgical history.   OB History   No obstetric history on file.     No  family history on file.  Social History   Tobacco Use  . Smoking status: Never Smoker  . Smokeless tobacco: Never Used  Substance Use Topics  . Alcohol use: No  . Drug use: Not on file    Home Medications Prior to Admission medications   Medication Sig Start Date End Date Taking? Authorizing Provider  benztropine (COGENTIN) 2 MG tablet Take 1 tablet (2 mg total) by mouth at bedtime. 10/14/18  Yes Daleen Bo, MD  cariprazine [facility]) capsule Take 1.5 mg by mouth daily.   Yes [provider]  Cholecalciferol (VITAMIN D3) 5000 units CAPS Take by mouth.   Yes [provider]  gabapentin (NEURONTIN) 600 MG tablet Take 600 mg by mouth in the morning and at bedtime.  10/17/19  Yes [provider]  lisinopril-hydrochlorothiazide (ZESTORETIC) 20-25 MG tablet Take 1 tablet by mouth daily. 10/17/19  Yes [provider]  lithium carbonate 300 MG capsule Take 300-600 mg by mouth See admin instructions. Take 1 capsule in the morning and 2 capsule at bedtime.   Yes [provider]  metFORMIN (GLUCOPHAGE) 1000 MG tablet Take 1,000 mg by mouth 2 (two) times daily. 10/17/19  Yes [provider]  omeprazole (PRILOSEC) 20 MG capsule Take 20 mg by mouth daily.   Yes [provider]  venlafaxine XR (EFFEXOR-XR) 75 MG 24 hr capsule Take 75 mg by mouth  daily.   Yes [provider]    Allergies    Fanapt [iloperidone], Lithium, and Novocain [procaine]  Review of Systems   Review of Systems 10 Systems reviewed and are negative for acute change except as noted in the HPI.  Physical Exam Updated Vital Signs BP 107/70   Pulse 96   Temp 99 F (37.2 C) (Oral)   Resp (!) 33   SpO2 99%   Physical Exam Constitutional:      Comments: Patient is alert but mildly ill in appearance.  She is nontoxic.  Tachypnea but not in respiratory distress.  HENT:     Mouth/Throat:     Comments: Airway widely patent.  Slight erythema of the  tonsillar pillars.  No exudate.  Small amount of whitish plaque on the tongue. Eyes:     Extraocular Movements: Extraocular movements intact.     Conjunctiva/sclera: Conjunctivae normal.     Pupils: Pupils are equal, round, and reactive to light.  Cardiovascular:     Comments: Borderline tachycardia.  No rub murmur gallop. Pulmonary:     Comments: Tachypnea.  Occasional crackle.  No significant wheezing.  Good airflow. Abdominal:     General: There is no distension.     Palpations: Abdomen is soft.     Tenderness: There is no abdominal tenderness. There is no guarding.  Musculoskeletal:        General: No swelling or tenderness. Normal range of motion.     Cervical back: Neck supple.     Right lower leg: No edema.     Left lower leg: No edema.  Skin:    General: Skin is warm and dry.     Findings: No rash.  Neurological:     General: No focal deficit present.     Mental Status: She is oriented to person, place, and time.     Cranial Nerves: No cranial nerve deficit.     Motor: No weakness.     Coordination: Coordination normal.  Psychiatric:        Mood and Affect: Mood normal.        Behavior: Behavior normal.     ED Results / Procedures / Treatments   Labs (all labs ordered are listed, but only abnormal results are displayed) Labs Reviewed  RESPIRATORY PANEL BY RT PCR (FLU A&B, COVID) - Abnormal; Notable for the following components:      Result Value   SARS Coronavirus 2 by RT PCR POSITIVE (*)    All other components within normal limits  COMPREHENSIVE METABOLIC PANEL - Abnormal; Notable for the following components:   Sodium 133 (*)    Potassium 3.4 (*)    CO2 21 (*)    Glucose, Bld 55 (*)    Creatinine, Ser 1.08 (*)    Calcium 8.8 (*)    Albumin 2.8 (*)    AST 46 (*)    All other components within normal limits  CBC WITH DIFFERENTIAL/PLATELET - Abnormal; Notable for the following components:   Hemoglobin 10.2 (*)    HCT 34.8 (*)    MCH 24.6 (*)    MCHC 29.3  (*)    All other components within normal limits  URINALYSIS, ROUTINE W REFLEX MICROSCOPIC - Abnormal; Notable for the following components:   Color, Urine STRAW (*)    Specific Gravity, Urine 1.004 (*)    All other components within normal limits  CBG MONITORING, ED - Abnormal; Notable for the following components:   Glucose-Capillary 123 (*)  All other components within normal limits  CBG MONITORING, ED - Abnormal; Notable for the following components:   Glucose-Capillary 33 (*)    All other components within normal limits  GROUP A STREP BY PCR  CULTURE, BLOOD (ROUTINE X 2)  CULTURE, BLOOD (ROUTINE X 2)  URINE CULTURE  LIPASE, BLOOD  LACTIC ACID, PLASMA  PROTIME-INR  MAGNESIUM  PHOSPHORUS  TSH  LACTIC ACID, PLASMA  LITHIUM LEVEL  BASIC METABOLIC PANEL  C-PEPTIDE  I-STAT BETA HCG BLOOD, ED (MC, WL, AP ONLY)    EKG None  Radiology DG Chest Port 1 View  Result Date: 11/28/2019 CLINICAL DATA:  Weakness and hypotensive. EXAM: PORTABLE CHEST 1 VIEW COMPARISON:  November 06, 2003 FINDINGS: There is no evidence of acute infiltrate, pleural effusion or pneumothorax. A 1.0 cm calcified nodular opacity is seen overlying the medial aspect of the left lung base. This represents a new finding when compared to the prior study. The heart size and mediastinal contours are within normal limits. The visualized skeletal structures are unremarkable. IMPRESSION: No active disease. Electronically Signed   By: Virgina Norfolk M.D.   On: 11/28/2019 21:03    Procedures Procedures (including critical care time)  CRITICAL CARE Performed by: Charlesetta Shanks   Total critical care time: 30 minutes  Critical care time was exclusive of separately billable procedures and treating other patients.  Critical care was necessary to treat or prevent imminent or life-threatening deterioration.  Critical care was time spent personally by me on the following activities: development of treatment plan with  patient and/or surrogate as well as nursing, discussions with consultants, evaluation of patient's response to treatment, examination of patient, obtaining history from patient or surrogate, ordering and performing treatments and interventions, ordering and review of laboratory studies, ordering and review of radiographic studies, pulse oximetry and re-evaluation of patient's condition. Medications Ordered in ED Medications  benztropine (COGENTIN) tablet 2 mg (has no administration in time range)  gabapentin (NEURONTIN) capsule 600 mg (has no administration in time range)  venlafaxine XR (EFFEXOR-XR) 24 hr capsule 75 mg (has no administration in time range)  pantoprazole (PROTONIX) EC tablet 40 mg (has no administration in time range)  cariprazine (VRAYLAR) capsule 1.5 mg (has no administration in time range)  dextrose 5 %-0.45 % sodium chloride infusion ( Intravenous New Bag/Given 11/28/19 2320)  lithium carbonate capsule 300 mg (has no administration in time range)    And  lithium carbonate capsule 600 mg (has no administration in time range)  dextrose 50 % solution (  Given 11/28/19 1850)  0.9 %  sodium chloride infusion ( Intravenous New Bag/Given 11/28/19 2251)  dextrose 50 % solution (  Given 11/28/19 2152)    ED Course  I have reviewed the triage vital signs and the nursing notes.  Pertinent labs & imaging results that were available during my care of the patient were reviewed by me and considered in my medical decision making (see chart for details).  Clinical Course as of Nov 27 2336  Mon Nov 28, 2019  2217 Patient has had recurrence of CBG down to 33.  She had eaten several snacks before this.  He does continue to be ill and uncomfortable in appearance.  Mental status clear.  Will give amp of D50 and start D5 normal saline drip.  Patient denies that she could have taken an extra dose of her diabetic medications.  She denies has been any dose changes in over a year.   [MP]    Clinical  Course User Index [MP] Charlesetta Shanks, MD   MDM Rules/Calculators/A&P                       Consult: Dr. Alcario Drought to admit Patient presents as outlined above.  She reports myalgias, cough, vomiting, diarrhea.  Patient reports she is diabetic on oral agents.  We will proceed with diagnostic work-up for infectious etiology in diabetic patient.  Patient has had recurrent episodes of hypoglycemia.  She is required 2 separate instances of D50.  Unclear why she is having recurrent episodes.  She takes Metformin and denies any changes.  She reports she is eating small amounts.  She reports some vomiting but only small amounts.  Possibly due to poor oral intake.  Will need admission for ongoing and recurrent hypoglycemia.  She has had general constitutional symptoms.  She does report some cough.  Patient is not hypoxic but does have some tachypnea.  Covid testing is positive.  At this time, from perspective Covid patient does not appear to have severe pulmonary complications.    Final Clinical Impression(s) / ED Diagnoses Final diagnoses:  Vomiting and diarrhea  Hypoglycemia  COVID-19    Rx / DC Orders ED Discharge Orders    None       Charlesetta Shanks, MD 11/28/19 2340    Charlesetta Shanks, MD 11/28/19 2345

## 2019-11-29 DIAGNOSIS — U071 COVID-19: Secondary | ICD-10-CM | POA: Diagnosis not present

## 2019-11-29 DIAGNOSIS — Z6841 Body Mass Index (BMI) 40.0 and over, adult: Secondary | ICD-10-CM | POA: Diagnosis not present

## 2019-11-29 DIAGNOSIS — E669 Obesity, unspecified: Secondary | ICD-10-CM | POA: Diagnosis present

## 2019-11-29 DIAGNOSIS — E118 Type 2 diabetes mellitus with unspecified complications: Secondary | ICD-10-CM | POA: Diagnosis not present

## 2019-11-29 DIAGNOSIS — R0602 Shortness of breath: Secondary | ICD-10-CM | POA: Diagnosis not present

## 2019-11-29 DIAGNOSIS — E11649 Type 2 diabetes mellitus with hypoglycemia without coma: Secondary | ICD-10-CM | POA: Diagnosis present

## 2019-11-29 DIAGNOSIS — F259 Schizoaffective disorder, unspecified: Secondary | ICD-10-CM

## 2019-11-29 DIAGNOSIS — R1110 Vomiting, unspecified: Secondary | ICD-10-CM

## 2019-11-29 DIAGNOSIS — I959 Hypotension, unspecified: Secondary | ICD-10-CM | POA: Diagnosis present

## 2019-11-29 DIAGNOSIS — J1282 Pneumonia due to coronavirus disease 2019: Secondary | ICD-10-CM | POA: Diagnosis present

## 2019-11-29 DIAGNOSIS — E162 Hypoglycemia, unspecified: Secondary | ICD-10-CM | POA: Diagnosis not present

## 2019-11-29 DIAGNOSIS — R197 Diarrhea, unspecified: Secondary | ICD-10-CM

## 2019-11-29 DIAGNOSIS — I10 Essential (primary) hypertension: Secondary | ICD-10-CM | POA: Diagnosis present

## 2019-11-29 DIAGNOSIS — Z7984 Long term (current) use of oral hypoglycemic drugs: Secondary | ICD-10-CM | POA: Diagnosis not present

## 2019-11-29 DIAGNOSIS — Z79899 Other long term (current) drug therapy: Secondary | ICD-10-CM | POA: Diagnosis not present

## 2019-11-29 LAB — CBG MONITORING, ED
Glucose-Capillary: 153 mg/dL — ABNORMAL HIGH (ref 70–99)
Glucose-Capillary: 155 mg/dL — ABNORMAL HIGH (ref 70–99)
Glucose-Capillary: 165 mg/dL — ABNORMAL HIGH (ref 70–99)
Glucose-Capillary: 187 mg/dL — ABNORMAL HIGH (ref 70–99)
Glucose-Capillary: 193 mg/dL — ABNORMAL HIGH (ref 70–99)
Glucose-Capillary: 226 mg/dL — ABNORMAL HIGH (ref 70–99)
Glucose-Capillary: 228 mg/dL — ABNORMAL HIGH (ref 70–99)
Glucose-Capillary: 233 mg/dL — ABNORMAL HIGH (ref 70–99)
Glucose-Capillary: 244 mg/dL — ABNORMAL HIGH (ref 70–99)
Glucose-Capillary: 245 mg/dL — ABNORMAL HIGH (ref 70–99)
Glucose-Capillary: 246 mg/dL — ABNORMAL HIGH (ref 70–99)
Glucose-Capillary: 61 mg/dL — ABNORMAL LOW (ref 70–99)

## 2019-11-29 LAB — URINE CULTURE

## 2019-11-29 LAB — BLOOD CULTURE ID PANEL (REFLEXED)

## 2019-11-29 LAB — LACTIC ACID, PLASMA: Lactic Acid, Venous: 2 mmol/L (ref 0.5–1.9)

## 2019-11-29 LAB — PROCALCITONIN: Procalcitonin: <0.1 ng/mL

## 2019-11-29 LAB — C-REACTIVE PROTEIN: CRP: 7.1 mg/dL — ABNORMAL HIGH (ref <1.0)

## 2019-11-29 LAB — D-DIMER, QUANTITATIVE: D-Dimer, Quant: 0.79 ug/mL-FEU — ABNORMAL HIGH (ref 0.00–0.50)

## 2019-11-29 LAB — HIV ANTIBODY (ROUTINE TESTING W REFLEX): HIV Screen 4th Generation wRfx: NONREACTIVE

## 2019-11-29 LAB — LITHIUM LEVEL: Lithium Lvl: 0.46 mmol/L — ABNORMAL LOW (ref 0.60–1.20)

## 2019-11-29 LAB — ABO/RH: ABO/RH(D): O POS

## 2019-11-29 MED ORDER — INSULIN ASPART 100 UNIT/ML ~~LOC~~ SOLN
0.0000 [IU] | Freq: Three times a day (TID) | SUBCUTANEOUS | Status: DC
  Administered 2019-11-30: 17:00:00 11 [IU] via SUBCUTANEOUS
  Administered 2019-11-30 (×2): 8 [IU] via SUBCUTANEOUS
  Administered 2019-12-01 (×2): 5 [IU] via SUBCUTANEOUS

## 2019-11-29 MED ORDER — INSULIN ASPART 100 UNIT/ML ~~LOC~~ SOLN
0.0000 [IU] | Freq: Every day | SUBCUTANEOUS | Status: DC
  Administered 2019-11-29: 22:00:00 2 [IU] via SUBCUTANEOUS

## 2019-11-29 MED ORDER — ENOXAPARIN SODIUM 40 MG/0.4ML ~~LOC~~ SOLN
40.0000 mg | Freq: Every day | SUBCUTANEOUS | Status: DC
  Administered 2019-11-29 – 2019-12-01 (×3): 40 mg via SUBCUTANEOUS
  Filled 2019-11-29 (×3): qty 0.4

## 2019-11-29 MED ORDER — ONDANSETRON HCL 4 MG PO TABS: 4.0000 mg | ORAL_TABLET | Freq: Four times a day (QID) | ORAL | Status: DC | PRN

## 2019-11-29 MED ORDER — ONDANSETRON HCL 4 MG/2ML IJ SOLN: 4.0000 mg | Freq: Four times a day (QID) | INTRAMUSCULAR | Status: DC | PRN

## 2019-11-29 NOTE — ED Notes (Signed)
Lunch Tray Ordered @1144. 

## 2019-11-29 NOTE — Progress Notes (Addendum)
PROGRESS NOTE                                                                                                                                                                                                             Patient Demographics:    Tracy Vaughn, is a 44 y.o. female, DOB - 1975-11-24, [id number]  Admit date - 11/28/2019   Admitting Physician Etta Quill, DO  Outpatient Primary MD for the patient is Osei-Bonsu, Iona Beard, MD  LOS - 0   Chief Complaint  Patient presents with  . Hypoglycemia  . Hypotension  . Tachycardia       Brief Narrative    This is a no charge note, patient was seen and admitted earlier today by Dr. Alcario Drought, chart, imaging and labs were reviewed.  HPI: Tracy Vaughn is a 44 y.o. female with medical history significant of DM2, schizoaffective disorder.  Pt feeling ill for past 1 week: generalized body aches, fatigue, not sure about fever, some cough, no SOB, no CP.  Has had several episodes of vomiting, diarrhea for past 3 days.  Crampy achy discomfort in abd.  Has sore throat.  BGL low with EMS, given D50.   ED Course: Is COVID-19 positive.  BGL again low here in ED at 55, then dropped to 33, given more D50, started on D5-NS.    Subjective:    Tracy Vaughn today reports generalized weakness, cough, she denies any chest pain, T-max is 103 earlier this morning.    Assessment  & Plan :    Principal Problem:   Hypoglycemia Active Problems:   Type 2 diabetes mellitus with complication, without long-term current use of insulin (HCC)   Schizoaffective disorder ([place])   COVID-19 virus infection  COVID-19 infection -Patient febrile 103, tachycardic on presentation, chest x-ray with no acute findings, patient with elevated CRP, as well with history of diabetes, and obesity, she is at high risk for decompensation, so we will keep on IV remdesivir, and will monitor inflammatory markers closely.  Diabetes  mellitus with hypoglycemia -Hold Metformin -Required D5 half-normal initially, her CBG currently evaded, so we will go ahead and start on sliding scale  1 of 4 BCx bottles now growing staph species with methicillin resistance detected. -This is most likely related to contamination, will monitor final results.   1.0 cm calcified nodular  opacity is seen overlying the medial aspect of the left lung base -She will need repeat imaging later during hair hospitalization to follow on this finding.    COVID-19 Labs  Recent Labs    11/29/19 0016  DDIMER 0.79*  CRP 7.1*    Lab Results  Component Value Date   SARSCOV2NAA POSITIVE (A) 11/28/2019     Lab Results  Component Value Date   PLT 247 11/28/2019    Antibiotics  :    Anti-infectives (From admission, onward)   Start     Dose/Rate Route Frequency Ordered Stop   11/30/19 1000  remdesivir 100 mg in sodium chloride 0.9 % 100 mL IVPB     100 mg 200 mL/hr over 30 Minutes Intravenous Daily 11/28/19 2344 12/04/19 0959   11/29/19 1000  remdesivir 100 mg in sodium chloride 0.9 % 100 mL IVPB  Status:  Discontinued     100 mg 200 mL/hr over 30 Minutes Intravenous Daily 11/28/19 2341 11/28/19 2344   11/29/19 0100  remdesivir 200 mg in sodium chloride 0.9% 250 mL IVPB     200 mg 580 mL/hr over 30 Minutes Intravenous Once 11/28/19 2344 11/29/19 0100   11/28/19 2345  remdesivir 200 mg in sodium chloride 0.9% 250 mL IVPB  Status:  Discontinued     200 mg 580 mL/hr over 30 Minutes Intravenous Once 11/28/19 2341 11/28/19 2344        Objective:   Vitals:   11/29/19 0438 11/29/19 0500 11/29/19 0542 11/29/19 0700  BP:  119/80  106/73  Pulse:  (!) 108  (!) 118  Resp:  (!) 39  (!) 35  Temp: (!) 103 F (39.4 C)  99.4 F (37.4 C)   TempSrc: Oral  Oral   SpO2:  96%  97%    Wt Readings from Last 3 Encounters:  03/29/19 136.1 kg  10/14/18 134.7 kg  04/15/18 134.4 kg    No intake or output data in the 24 hours ending 11/29/19  1427   Physical Exam  Awake Alert, Oriented X 3, No new F.N deficits, Normal affect Symmetrical Chest wall movement, Good air movement bilaterally, CTAB RRR,No Gallops,Rubs or new Murmurs, No Parasternal Heave +ve B.Sounds, Abd Soft, No tenderness,  No rebound - guarding or rigidity. No Cyanosis, Clubbing or edema, No new Rash or bruise      Data Review:    CBC Recent Labs  Lab 11/28/19 2119  WBC 7.0  HGB 10.2*  HCT 34.8*  PLT 247  MCV 83.9  MCH 24.6*  MCHC 29.3*  RDW 15.4  LYMPHSABS 1.4  MONOABS 0.3  EOSABS 0.0  BASOSABS 0.0    Chemistries  Recent Labs  Lab 11/28/19 2119  NA 133*  K 3.4*  CL 102  CO2 21*  GLUCOSE 55*  BUN 8  CREATININE 1.08*  CALCIUM 8.8*  MG 1.9  AST 46*  ALT 34  ALKPHOS 44  BILITOT 0.4   ------------------------------------------------------------------------------------------------------------------ No results for input(s): CHOL, HDL, LDLCALC, TRIG, CHOLHDL, LDLDIRECT in the last 72 hours.  No results found for: HGBA1C ------------------------------------------------------------------------------------------------------------------ Recent Labs    11/28/19 2119  TSH 0.908   ------------------------------------------------------------------------------------------------------------------ No results for input(s): VITAMINB12, FOLATE, FERRITIN, TIBC, IRON, RETICCTPCT in the last 72 hours.  Coagulation profile Recent Labs  Lab 11/28/19 2119  INR 1.0    Recent Labs    11/29/19 0016  DDIMER 0.79*    Cardiac Enzymes No results for input(s): CKMB, TROPONINI, MYOGLOBIN in the last 168 hours.  Invalid input(s):  CK ------------------------------------------------------------------------------------------------------------------ No results found for: BNP  Inpatient Medications  Scheduled Meds: . benztropine  2 mg Oral QHS  . cariprazine  1.5 mg Oral Daily  . enoxaparin (LOVENOX) injection  40 mg Subcutaneous Daily  .  gabapentin  600 mg Oral BID  . lithium carbonate  300 mg Oral Daily   And  . lithium carbonate  600 mg Oral QHS  . pantoprazole  40 mg Oral Daily  . venlafaxine XR  75 mg Oral Q breakfast   Continuous Infusions: . dextrose 5 % and 0.45% NaCl 75 mL/hr at 11/29/19 0914  . [START ON 11/30/2019] remdesivir 100 mg in NS 100 mL     PRN Meds:.acetaminophen, chlorpheniramine-HYDROcodone, guaiFENesin-dextromethorphan, ondansetron **OR** ondansetron (ZOFRAN) IV  Micro Results Recent Results (from the past 240 hour(s))  Culture, blood (routine x 2)     Status: None (Preliminary result)   Collection Time: 11/28/19  9:19 PM   Specimen: BLOOD  Result Value Ref Range Status   Specimen Description BLOOD LEFT ANTECUBITAL  Final   Special Requests   Final    BOTTLES DRAWN AEROBIC AND ANAEROBIC Blood Culture adequate volume   Culture   Final    NO GROWTH < 12 HOURS Performed at [facility], [address], [place], [place] [address]    Report Status PENDING  Incomplete  Culture, blood (routine x 2)     Status: None (Preliminary result)   Collection Time: 11/28/19  9:19 PM   Specimen: BLOOD  Result Value Ref Range Status   Specimen Description BLOOD RIGHT ANTECUBITAL  Final   Special Requests   Final    BOTTLES DRAWN AEROBIC AND ANAEROBIC Blood Culture adequate volume   Culture   Final    NO GROWTH < 12 HOURS Performed at [facility], [place] [address], [place], [place] [address]    Report Status PENDING  Incomplete  Respiratory Panel by RT PCR (Flu A&B, Covid) - Throat     Status: Abnormal   Collection Time: 11/28/19  9:21 PM   Specimen: Throat  Result Value Ref Range Status   SARS Coronavirus 2 by RT PCR POSITIVE (A) NEGATIVE Final    Comment: RESULT CALLED TO, READ BACK BY AND VERIFIED WITH: T [facility] RN 11/28/2331 JDW (NOTE) SARS-CoV-2 target nucleic acids are DETECTED. SARS-CoV-2 RNA is generally detectable in upper respiratory specimens  during the acute phase of  infection. Positive results are indicative of the presence of the identified virus, but do not rule out bacterial infection or co-infection with other pathogens not detected by the test. Clinical correlation with patient history and other diagnostic information is necessary to determine patient infection status. The expected result is Negative. Fact Sheet for Patients:  PinkCheek.be Fact Sheet for Healthcare Providers: GravelBags.it This test is not yet approved or cleared by the [place] FDA and  has been authorized for detection and/or diagnosis of SARS-CoV-2 by FDA under an Emergency Use Authorization (EUA).  This EUA will remain in effect (meaning this test can be used) for the d uration of  the COVID-19 declaration under Section 564(b)(1) of the Act, 21 U.S.C. section 360bbb-3(b)(1), unless the authorization is terminated or revoked sooner.    Influenza A by PCR NEGATIVE NEGATIVE Final   Influenza B by PCR NEGATIVE NEGATIVE Final    Comment: (NOTE) The Xpert Xpress SARS-CoV-2/FLU/RSV assay is intended as an aid in  the diagnosis of influenza from Nasopharyngeal swab specimens and  should not be used as  a sole basis for treatment. Nasal washings and  aspirates are unacceptable for Xpert Xpress SARS-CoV-2/FLU/RSV  testing. Fact Sheet for Patients: PinkCheek.be Fact Sheet for Healthcare Providers: GravelBags.it This test is not yet approved or cleared by the [place] FDA and  has been authorized for detection and/or diagnosis of SARS-CoV-2 by  FDA under an Emergency Use Authorization (EUA). This EUA will remain  in effect (meaning this test can be used) for the duration of the  Covid-19 declaration under Section 564(b)(1) of the Act, 21  U.S.C. section 360bbb-3(b)(1), unless the authorization is  terminated or revoked. Performed at [facility],  [place] [address], [place], [place] [address]   Group A Strep by PCR     Status: None   Collection Time: 11/28/19  9:22 PM   Specimen: Throat; Sterile Swab  Result Value Ref Range Status   Group A Strep by PCR NOT DETECTED NOT DETECTED Final    Comment: Performed at [facility], [place] [address] [place], [place] [address]    Radiology Reports DG Chest [place] 1 View  Result Date: 11/28/2019 CLINICAL DATA:  Weakness and hypotensive. EXAM: PORTABLE CHEST 1 VIEW COMPARISON:  November 06, 2003 FINDINGS: There is no evidence of acute infiltrate, pleural effusion or pneumothorax. A 1.0 cm calcified nodular opacity is seen overlying the medial aspect of the left lung base. This represents a new finding when compared to the prior study. The heart size and mediastinal contours are within normal limits. The visualized skeletal structures are unremarkable. IMPRESSION: No active disease. Electronically Signed   By: Virgina Norfolk M.D.   On: 11/28/2019 21:03     Phillips Climes M.D on 11/29/2019 at 2:27 PM  Between 7am to 7pm - Pager - [phone]  After 7pm go to [email or web address] - password [facility]  [facility] -  Office  [phone]

## 2019-11-29 NOTE — Progress Notes (Signed)
[facility] in [facility] - staph sp. mec A+PHARMACY - PHYSICIAN COMMUNICATION CRITICAL VALUE ALERT - BLOOD CULTURE IDENTIFICATION (BCID)  Tracy Vaughn is an 44 y.o. female who presented to [facility] on 11/28/2019  Assessment:  16 yof presenting COVID-19 positive and started on Remdesivir. 1 of 4 BCx bottles now growing staph species with methicillin resistance detected.  Name of physician (or Provider) Contacted: Elgergawy, D  Current antibiotics: none  Changes to prescribed antibiotics recommended:  None - likely contaminant  Results for orders placed or performed during the hospital encounter of 11/28/19  Blood Culture ID Panel (Reflexed) (Collected: 11/28/2019  9:19 PM)  Result Value Ref Range   Enterococcus species NOT DETECTED NOT DETECTED   Listeria monocytogenes NOT DETECTED NOT DETECTED   Staphylococcus species DETECTED (A) NOT DETECTED   Staphylococcus aureus (BCID) NOT DETECTED NOT DETECTED   Methicillin resistance DETECTED (A) NOT DETECTED   Streptococcus species NOT DETECTED NOT DETECTED   Streptococcus agalactiae NOT DETECTED NOT DETECTED   Streptococcus pneumoniae NOT DETECTED NOT DETECTED   Streptococcus pyogenes NOT DETECTED NOT DETECTED   Acinetobacter baumannii NOT DETECTED NOT DETECTED   Enterobacteriaceae species NOT DETECTED NOT DETECTED   Enterobacter cloacae complex NOT DETECTED NOT DETECTED   Escherichia coli NOT DETECTED NOT DETECTED   Klebsiella oxytoca NOT DETECTED NOT DETECTED   Klebsiella pneumoniae NOT DETECTED NOT DETECTED   Proteus species NOT DETECTED NOT DETECTED   Serratia marcescens NOT DETECTED NOT DETECTED   Haemophilus influenzae NOT DETECTED NOT DETECTED   Neisseria meningitidis NOT DETECTED NOT DETECTED   Pseudomonas aeruginosa NOT DETECTED NOT DETECTED   Candida albicans NOT DETECTED NOT DETECTED   Candida glabrata NOT DETECTED NOT DETECTED   Candida krusei NOT DETECTED NOT DETECTED   Candida parapsilosis NOT DETECTED NOT DETECTED   Candida  tropicalis NOT DETECTED NOT DETECTED   Arturo Morton, PharmD, BCPS Please check AMION for all [place] contact numbers Clinical Pharmacist 11/29/2019 5:12 PM

## 2019-11-29 NOTE — H&P (Signed)
History and Physical    Tracy Vaughn [id number] DOB: 1976/03/06 DOA: 11/28/2019  PCP: Benito Mccreedy, MD  Patient coming from: Home  I have personally briefly reviewed patient's old medical records in [place]  Chief Complaint: Hypoglycemia, hypotension, tachycardia  HPI: Tracy Vaughn is a 44 y.o. female with medical history significant of DM2, schizoaffective disorder.  Pt feeling ill for past 1 week: generalized body aches, fatigue, not sure about fever, some cough, no SOB, no CP.  Has had several episodes of vomiting, diarrhea for past 3 days.  Crampy achy discomfort in abd.  Has sore throat.  BGL low with EMS, given D50.   ED Course: Is COVID-19 positive.  BGL again low here in ED at 55, then dropped to 33, given more D50, started on D5-NS.   Review of Systems: As per HPI, otherwise all review of systems negative.  Past Medical History:  Diagnosis Date  . Diabetes mellitus without complication ([place])   . Mental impairment   . Schizoaffective disorder ([place])     History reviewed. No pertinent surgical history.   reports that she has never smoked. She has never used smokeless tobacco. She reports that she does not drink alcohol. No history on file for drug.  Allergies  Allergen Reactions  . Fanapt [Iloperidone]     Unknown, but takes the cogentin to fix the side effects  . Lithium     Takes cogentin to adverse reaction to medication  . Novocain [Procaine] Rash    bumps    No family history on file. No reported [place] contacts.  Prior to Admission medications   Medication Sig Start Date End Date Taking? Authorizing Provider  benztropine (COGENTIN) 2 MG tablet Take 1 tablet (2 mg total) by mouth at bedtime. 10/14/18  Yes Daleen Bo, MD  cariprazine [facility]) capsule Take 1.5 mg by mouth daily.   Yes [provider]  Cholecalciferol (VITAMIN D3) 5000 units CAPS Take by mouth.   Yes [provider]  gabapentin (NEURONTIN) 600 MG tablet  Take 600 mg by mouth in the morning and at bedtime.  10/17/19  Yes [provider]  lisinopril-hydrochlorothiazide (ZESTORETIC) 20-25 MG tablet Take 1 tablet by mouth daily. 10/17/19  Yes [provider]  lithium carbonate 300 MG capsule Take 300-600 mg by mouth See admin instructions. Take 1 capsule in the morning and 2 capsule at bedtime.   Yes [provider]  metFORMIN (GLUCOPHAGE) 1000 MG tablet Take 1,000 mg by mouth 2 (two) times daily. 10/17/19  Yes [provider]  omeprazole (PRILOSEC) 20 MG capsule Take 20 mg by mouth daily.   Yes [provider]  venlafaxine XR (EFFEXOR-XR) 75 MG 24 hr capsule Take 75 mg by mouth daily.   Yes [provider]    Physical Exam: Vitals:   11/28/19 1848 11/28/19 1900 11/28/19 1915 11/28/19 1930  BP: 105/60 111/69 108/78 107/70  Pulse: (!) 106 (!) 103 96   Resp: 18 (!) 24 (!) 33 (!) 33  Temp: 99 F (37.2 C)     TempSrc: Oral     SpO2: 97% 97% 99%     Constitutional: NAD, calm, comfortable Eyes: PERRL, lids and conjunctivae normal ENMT: Mucous membranes are moist. Posterior pharynx clear of any exudate or lesions.Normal dentition.  Neck: normal, supple, no masses, no thyromegaly Respiratory: clear to auscultation bilaterally, no wheezing, no crackles. Normal respiratory effort. No accessory muscle use.  Cardiovascular: Regular rate and rhythm, no murmurs / rubs / gallops. No  extremity edema. 2+ pedal pulses. No carotid bruits.  Abdomen: no tenderness, no masses palpated. No hepatosplenomegaly. Bowel sounds positive.  Musculoskeletal: no clubbing / cyanosis. No joint deformity upper and lower extremities. Good ROM, no contractures. Normal muscle tone.  Skin: no rashes, lesions, ulcers. No induration Neurologic: CN 2-12 grossly intact. Sensation intact, DTR normal. Strength 5/5 in all 4.  Psychiatric: Normal judgment and insight. Alert and oriented x 3. Normal mood.    Labs on Admission: I have  personally reviewed following labs and imaging studies  CBC: Recent Labs  Lab 11/28/19 2119  WBC 7.0  NEUTROABS 5.2  HGB 10.2*  HCT 34.8*  MCV 83.9  PLT [id number]   Basic Metabolic Panel: Recent Labs  Lab 11/28/19 2119  NA 133*  K 3.4*  CL 102  CO2 21*  GLUCOSE 55*  BUN 8  CREATININE 1.08*  CALCIUM 8.8*  MG 1.9  PHOS 2.9   GFR: CrCl cannot be calculated (Unknown ideal weight.). Liver Function Tests: Recent Labs  Lab 11/28/19 2119  AST 46*  ALT 34  ALKPHOS 44  BILITOT 0.4  PROT 7.7  ALBUMIN 2.8*   Recent Labs  Lab 11/28/19 2119  LIPASE 29   No results for input(s): AMMONIA in the last 168 hours. Coagulation Profile: Recent Labs  Lab 11/28/19 2119  INR 1.0   Cardiac Enzymes: No results for input(s): CKTOTAL, CKMB, CKMBINDEX, TROPONINI in the last 168 hours. BNP (last 3 results) No results for input(s): PROBNP in the last 8760 hours. HbA1C: No results for input(s): HGBA1C in the last 72 hours. CBG: Recent Labs  Lab 11/28/19 1904 11/28/19 2144  GLUCAP 123* 33*   Lipid Profile: No results for input(s): CHOL, HDL, LDLCALC, TRIG, CHOLHDL, LDLDIRECT in the last 72 hours. Thyroid Function Tests: Recent Labs    11/28/19 2119  TSH 0.908   Anemia Panel: No results for input(s): VITAMINB12, FOLATE, FERRITIN, TIBC, IRON, RETICCTPCT in the last 72 hours. Urine analysis:    Component Value Date/Time   COLORURINE STRAW (A) 11/28/2019 2156   APPEARANCEUR CLEAR 11/28/2019 2156   LABSPEC 1.004 (L) 11/28/2019 2156   PHURINE 8.0 11/28/2019 2156   GLUCOSEU NEGATIVE 11/28/2019 2156   HGBUR NEGATIVE 11/28/2019 2156   [place] NEGATIVE 11/28/2019 2156   [place] NEGATIVE 11/28/2019 2156   PROTEINUR NEGATIVE 11/28/2019 2156   UROBILINOGEN 1.0 11/06/2012 2014   NITRITE NEGATIVE 11/28/2019 2156   LEUKOCYTESUR NEGATIVE 11/28/2019 2156    Radiological Exams on Admission: DG Chest Port 1 View  Result Date: 11/28/2019 CLINICAL DATA:  Weakness and  hypotensive. EXAM: PORTABLE CHEST 1 VIEW COMPARISON:  November 06, 2003 FINDINGS: There is no evidence of acute infiltrate, pleural effusion or pneumothorax. A 1.0 cm calcified nodular opacity is seen overlying the medial aspect of the left lung base. This represents a new finding when compared to the prior study. The heart size and mediastinal contours are within normal limits. The visualized skeletal structures are unremarkable. IMPRESSION: No active disease. Electronically Signed   By: Virgina Norfolk M.D.   On: 11/28/2019 21:03    EKG: Independently reviewed.  Assessment/Plan Principal Problem:   Hypoglycemia Active Problems:   Type 2 diabetes mellitus with complication, without long-term current use of insulin (HCC)   Schizoaffective disorder ([place])   COVID-19 virus infection    1. Hypoglycemia - 1. Presumably due to being ill in setting of COVID-19 2. Though only on metformin for DM 3. Check C-Peptide 4. D5-half at 125 cc/hr for now 5. Q2H CBG  checks 6. Hypoglycemia protocol PRN 2. DM2 - 1. Hold metformin 3. COVID-19 - 1. Remdesivir per pharm 2. No O2 requirement currently 3. CXR neg 4. Checking CRP, D.dimer, Procalcitonin 5. Repeat CBC/CMP in AM 4. Schizoaffective - 1. Cont current home meds  DVT prophylaxis: Lovenox Code Status: Full Family Communication: No family in room Disposition Plan: Home after CBG stabilized Consults called: None Admission status: Place in 58   Terence Bart, [place] [facility]  How to contact the [facility] Attending or Consulting provider [place] or covering provider during after hours [place], for this patient?  1. Check the care team in [facility] and look for a) attending/consulting [facility] provider listed and b) the [facility] team listed 2. Log into [email or web address]  Amion Physician Scheduling and messaging for groups and whole hospitals  On call and physician scheduling software for group practices, residents, hospitalists and other medical providers for  call, clinic, rotation and shift schedules. On[employer] is a hospital-wide system for scheduling doctors and paging doctors on call. EasyPlot is for scientific plotting and data analysis.  [email or web address]  and use [place]'s universal password to access. If you do not have the password, please contact the hospital operator.  3. Locate the [facility] provider you are looking for under [facility] and page to a number that you can be directly reached. 4. If you still have difficulty reaching the provider, please page the [facility] (Director on Call) for the Hospitalists listed on amion for assistance.  11/29/2019, 12:04 AM

## 2019-11-30 ENCOUNTER — Inpatient Hospital Stay (HOSPITAL_COMMUNITY): Payer: Medicaid Other

## 2019-11-30 DIAGNOSIS — F259 Schizoaffective disorder, unspecified: Secondary | ICD-10-CM | POA: Diagnosis not present

## 2019-11-30 DIAGNOSIS — E162 Hypoglycemia, unspecified: Secondary | ICD-10-CM | POA: Diagnosis not present

## 2019-11-30 DIAGNOSIS — U071 COVID-19: Secondary | ICD-10-CM | POA: Diagnosis not present

## 2019-11-30 LAB — COMPREHENSIVE METABOLIC PANEL
ALT: 36 U/L (ref 0–44)
AST: 31 U/L (ref 15–41)
Albumin: 2.6 g/dL — ABNORMAL LOW (ref 3.5–5.0)
Alkaline Phosphatase: 52 U/L (ref 38–126)
Anion gap: 10 (ref 5–15)
BUN: 11 mg/dL (ref 6–20)
CO2: 18 mmol/L — ABNORMAL LOW (ref 22–32)
Calcium: 9.1 mg/dL (ref 8.9–10.3)
Chloride: 108 mmol/L (ref 98–111)
Creatinine, Ser: 0.95 mg/dL (ref 0.44–1.00)
GFR calc Af Amer: >60 mL/min (ref >60)
GFR calc non Af Amer: >60 mL/min (ref >60)
Glucose, Bld: 293 mg/dL — ABNORMAL HIGH (ref 70–99)
Potassium: 3.8 mmol/L (ref 3.5–5.1)
Sodium: 136 mmol/L (ref 135–145)
Total Bilirubin: 0.7 mg/dL (ref 0.3–1.2)
Total Protein: 7.5 g/dL (ref 6.5–8.1)

## 2019-11-30 LAB — CBC
HCT: 34.4 % — ABNORMAL LOW (ref 36.0–46.0)
Hemoglobin: 10.2 g/dL — ABNORMAL LOW (ref 12.0–15.0)
MCH: 24.6 pg — ABNORMAL LOW (ref 26.0–34.0)
MCHC: 29.7 g/dL — ABNORMAL LOW (ref 30.0–36.0)
MCV: 83.1 fL (ref 80.0–100.0)
Platelets: 262 10*3/uL (ref 150–400)
RBC: 4.14 MIL/uL (ref 3.87–5.11)
RDW: 15.8 % — ABNORMAL HIGH (ref 11.5–15.5)
WBC: 5.6 10*3/uL (ref 4.0–10.5)
nRBC: 0 % (ref 0.0–0.2)

## 2019-11-30 LAB — CBG MONITORING, ED
Glucose-Capillary: 207 mg/dL — ABNORMAL HIGH (ref 70–99)
Glucose-Capillary: 241 mg/dL — ABNORMAL HIGH (ref 70–99)
Glucose-Capillary: 261 mg/dL — ABNORMAL HIGH (ref 70–99)

## 2019-11-30 LAB — C-PEPTIDE: C-Peptide: 6.3 ng/mL — ABNORMAL HIGH (ref 1.1–4.4)

## 2019-11-30 LAB — HEMOGLOBIN A1C
Hgb A1c MFr Bld: 6.9 % — ABNORMAL HIGH (ref 4.8–5.6)
Mean Plasma Glucose: 151.33 mg/dL

## 2019-11-30 LAB — GLUCOSE, CAPILLARY
Glucose-Capillary: 287 mg/dL — ABNORMAL HIGH (ref 70–99)
Glucose-Capillary: 315 mg/dL — ABNORMAL HIGH (ref 70–99)
Glucose-Capillary: 193 mg/dL — ABNORMAL HIGH (ref 70–99)

## 2019-11-30 LAB — C-REACTIVE PROTEIN: CRP: 15 mg/dL — ABNORMAL HIGH (ref <1.0)

## 2019-11-30 LAB — D-DIMER, QUANTITATIVE: D-Dimer, Quant: 0.8 ug/mL-FEU — ABNORMAL HIGH (ref 0.00–0.50)

## 2019-11-30 NOTE — ED Notes (Signed)
Breakfast tray at bedside 

## 2019-11-30 NOTE — Progress Notes (Addendum)
Inpatient Diabetes Program Recommendations  AACE/ADA: New Consensus Statement on Inpatient Glycemic Control (2015)  Target Ranges:  Prepandial:   less than 140 mg/dL      Peak postprandial:   less than 180 mg/dL (1-2 hours)      Critically ill patients:  140 - 180 mg/dL   Lab Results  Component Value Date   GLUCAP 261 (H) 11/30/2019   HGBA1C 6.9 (H) 11/30/2019    Review of Glycemic Control Results for Tracy Vaughn, Tracy Vaughn (MRN [id number]) as of 11/30/2019 10:17  Ref. Range 11/29/2019 21:56 11/29/2019 23:52 11/30/2019 01:46 11/30/2019 06:27 11/30/2019 08:20  Glucose-Capillary Latest Ref Range: 70 - 99 mg/dL 245 (H) 244 (H) 207 (H) 241 (H) 261 (H)   Diabetes history: DM 2 Outpatient Diabetes medications: Metformin 1000 mg bid Current orders for Inpatient glycemic control:  Novolog 0-15 units tid + hs  Inpatient Diabetes Program Recommendations:    - Consider Levemir 10 units if trends continue to be elevated after D5 is discontinued - Consider Tradjenta 5 mg Daily  Thanks,  Tama Headings RN, MSN, BC-ADM Inpatient Diabetes Coordinator Team Pager [phone] (8a-5p)

## 2019-11-30 NOTE — Progress Notes (Signed)
Tracy Vaughn is a 44 y.o. female patient admitted from ED awake, alert - oriented  X 4 - no acute distress noted.  VSS - Blood pressure (!) 132/91, pulse 91, temperature 98.4 F (36.9 C), temperature source Oral, resp. rate 19, SpO2 96 %.  IV in place, occlusive dsg intact without redness.  Orientation to room, and floor completed with information packet given to patient. Patient declined safety video at this time. Call light within reach, patient able to voice, and demonstrate understanding. Skin, clean-dry- intact without evidence of bruising, or skin tears.   No evidence of skin break down noted on exam.     Dene Gentry, RN 11/30/2019 1:45 PM

## 2019-11-30 NOTE — Progress Notes (Signed)
PROGRESS NOTE                                                                                                                                                                                                             Patient Demographics:    Tracy Vaughn, is a 44 y.o. female, DOB - 1976/01/10, [id number]  Outpatient Primary MD for the patient is Benito Mccreedy, MD   Admit date - 11/28/2019   LOS - 1  Chief Complaint  Patient presents with  . Hypoglycemia  . Hypotension  . Tachycardia       Brief Narrative: Patient is a 44 y.o. female with PMHx of DM-2, schizoaffective disorder-presented with 1 week history of fever, body aches, fatigue, cough-found to have COVID-19 and hypoglycemia.  Significant Events: 4/26>> admit for COVID-19 and hypoglycemia  COVID-19 medications: Remdesivir: 4/26  Antibiotics: None  Microbiology data: 4/26: Blood culture>> 1/2 coag negative methicillin resistant staph 4/26: Urine culture>> multiple species  DVT prophylaxis: SQ Lovenox  Procedures: None  Consults: None    Subjective:    Tracy Vaughn today feels overall better-she is not on oxygen-however continues to have coughing spells.   Assessment  & Plan :   Probable COVID-19 pneumonia: Continues to cough-CRP increasing-no oxygen requirement as of yet.  Continue remdesivir-hold off on starting steroids-repeat chest x-ray today.  Follow closely-she remains at risk for significant disease.  Fever: afebrile  O2 requirements:  SpO2: 94 %   COVID-19 Labs: Recent Labs    11/29/19 0016 11/30/19 0741  DDIMER 0.79* 0.80*  CRP 7.1* 15.0*    No results found for: BNP  Recent Labs  Lab 11/29/19 0016  PROCALCITON <0.10    Lab Results  Component Value Date   SARSCOV2NAA POSITIVE (A) 11/28/2019     Prone/Incentive Spirometry: encouraged  incentive spirometry use 3-4/hour.  Coag negative staph bacteremia:  Likely contaminant-await final culture results  DM-2 (A1c 6.9 on 4/28) with hypoglycemia: CBGs now stable-not sure regarding etiology of hypoglycemia-as she was on Metformin.  Continue to monitor on SSI  HTN: BP stable-continue to hold antihypertensives for now.  1cm calcified nodal nodular opacity seen in the left lung: We will need to repeat imaging in the outpatient setting at the discretion of her PCP.  Schizoaffective disorder: Remains stable-continue cariprazine, lithium, Effexor and benztropine.  Obesity: Estimated body mass  index is 48.42 kg/m as calculated from the following:   Height as of 03/29/19: 5\' 6"  (1.676 m).   Weight as of 03/29/19: 136.1 kg.    ABG: No results found for: PHART, PCO2ART, PO2ART, HCO3, TCO2, ACIDBASEDEF, O2SAT  Vent Settings: N/A  Condition - Stable  Family Communication  : Briefly spoke to mother who does not speak English-then tried to call sister-left a voice message on 4/28  Code Status :  Full Code  Diet :  Diet Order            Diet Carb Modified Fluid consistency: Thin; Room service appropriate? Yes  Diet effective now               Disposition Plan  :   Remains inpatient appropriate because:Inpatient level of care appropriate due to severity of illness   Dispo: The patient is from: Home              Anticipated d/c is to: Home              Anticipated d/c date is: 3 days              Patient currently is not medically stable to d/c.  Barriers to discharge: Hypoxia requiring O2 supplementation/complete 5 days of IV Remdesivir  Antimicorbials  :    Anti-infectives (From admission, onward)   Start     Dose/Rate Route Frequency Ordered Stop   11/30/19 1000  remdesivir 100 mg in sodium chloride 0.9 % 100 mL IVPB     100 mg 200 mL/hr over 30 Minutes Intravenous Daily 11/28/19 2344 12/04/19 0959   11/29/19 1000  remdesivir 100 mg in sodium chloride 0.9 % 100 mL IVPB  Status:  Discontinued     100 mg 200 mL/hr over 30 Minutes  Intravenous Daily 11/28/19 2341 11/28/19 2344   11/29/19 0100  remdesivir 200 mg in sodium chloride 0.9% 250 mL IVPB     200 mg 580 mL/hr over 30 Minutes Intravenous Once 11/28/19 2344 11/29/19 0100   11/28/19 2345  remdesivir 200 mg in sodium chloride 0.9% 250 mL IVPB  Status:  Discontinued     200 mg 580 mL/hr over 30 Minutes Intravenous Once 11/28/19 2341 11/28/19 2344      Inpatient Medications  Scheduled Meds: . benztropine  2 mg Oral QHS  . cariprazine  1.5 mg Oral Daily  . enoxaparin (LOVENOX) injection  40 mg Subcutaneous Daily  . gabapentin  600 mg Oral BID  . insulin aspart  0-15 Units Subcutaneous TID WC  . insulin aspart  0-5 Units Subcutaneous QHS  . lithium carbonate  300 mg Oral Daily   And  . lithium carbonate  600 mg Oral QHS  . pantoprazole  40 mg Oral Daily  . venlafaxine XR  75 mg Oral Q breakfast   Continuous Infusions: . remdesivir 100 mg in NS 100 mL     PRN Meds:.acetaminophen, chlorpheniramine-HYDROcodone, guaiFENesin-dextromethorphan, ondansetron **OR** ondansetron (ZOFRAN) IV   Time Spent in minutes  25  See all Orders from today for further details   Oren Binet M.D on 11/30/2019 at 10:12 AM  To page go to [email or web address] - use universal password  [facility] -  Office  [phone]    Objective:   Vitals:   11/30/19 0649 11/30/19 0700 11/30/19 0800 11/30/19 0930  BP: 100/60 (!) 115/59 110/68   Pulse: (!) 108 (!) 115 (!) 101 95  Resp:  18    Temp:  TempSrc:      SpO2: 97% 99% 100% 94%    Wt Readings from Last 3 Encounters:  03/29/19 136.1 kg  10/14/18 134.7 kg  04/15/18 134.4 kg    No intake or output data in the 24 hours ending 11/30/19 1012   Physical Exam Gen Exam:Alert awake-not in any distress HEENT:atraumatic, normocephalic Chest: B/L clear to auscultation anteriorly CVS:S1S2 regular Abdomen:soft non tender, non distended Extremities:no edema Neurology: Non focal Skin: no rash   Data Review:     CBC Recent Labs  Lab 11/28/19 2119 11/30/19 0741  WBC 7.0 5.6  HGB 10.2* 10.2*  HCT 34.8* 34.4*  PLT 247 262  MCV 83.9 83.1  MCH 24.6* 24.6*  MCHC 29.3* 29.7*  RDW 15.4 15.8*  LYMPHSABS 1.4  --   MONOABS 0.3  --   EOSABS 0.0  --   BASOSABS 0.0  --     Chemistries  Recent Labs  Lab 11/28/19 2119 11/30/19 0741  NA 133* 136  K 3.4* 3.8  CL 102 108  CO2 21* 18*  GLUCOSE 55* 293*  BUN 8 11  CREATININE 1.08* 0.95  CALCIUM 8.8* 9.1  MG 1.9  --   AST 46* 31  ALT 34 36  ALKPHOS 44 52  BILITOT 0.4 0.7   ------------------------------------------------------------------------------------------------------------------ No results for input(s): CHOL, HDL, LDLCALC, TRIG, CHOLHDL, LDLDIRECT in the last 72 hours.  Lab Results  Component Value Date   HGBA1C 6.9 (H) 11/30/2019   ------------------------------------------------------------------------------------------------------------------ Recent Labs    11/28/19 2119  TSH 0.908   ------------------------------------------------------------------------------------------------------------------ No results for input(s): VITAMINB12, FOLATE, FERRITIN, TIBC, IRON, RETICCTPCT in the last 72 hours.  Coagulation profile Recent Labs  Lab 11/28/19 2119  INR 1.0    Recent Labs    11/29/19 0016 11/30/19 0741  DDIMER 0.79* 0.80*    Cardiac Enzymes No results for input(s): CKMB, TROPONINI, MYOGLOBIN in the last 168 hours.  Invalid input(s): CK ------------------------------------------------------------------------------------------------------------------ No results found for: BNP  Micro Results Recent Results (from the past 240 hour(s))  Culture, blood (routine x 2)     Status: None (Preliminary result)   Collection Time: 11/28/19  9:19 PM   Specimen: BLOOD  Result Value Ref Range Status   Specimen Description BLOOD LEFT ANTECUBITAL  Final   Special Requests   Final    BOTTLES DRAWN AEROBIC AND ANAEROBIC  Blood Culture adequate volume   Culture  Setup Time   Final    GRAM POSITIVE COCCI IN CLUSTERS AEROBIC BOTTLE ONLY Organism ID to follow CRITICAL RESULT CALLED TO, READ BACK BY AND VERIFIED WITHJiles Garter [facility] PHARMD 1710 11/29/19 A BROWNING Performed at [facility], [place] [address] [place], [place] [address]    Culture GRAM POSITIVE COCCI  Final   Report Status PENDING  Incomplete  Culture, blood (routine x 2)     Status: None (Preliminary result)   Collection Time: 11/28/19  9:19 PM   Specimen: BLOOD  Result Value Ref Range Status   Specimen Description BLOOD RIGHT ANTECUBITAL  Final   Special Requests   Final    BOTTLES DRAWN AEROBIC AND ANAEROBIC Blood Culture adequate volume   Culture  Setup Time   Final    IN BOTH AEROBIC AND ANAEROBIC BOTTLES GRAM POSITIVE COCCI IN CLUSTERS CRITICAL VALUE NOTED.  VALUE IS CONSISTENT WITH PREVIOUSLY REPORTED AND CALLED VALUE. Performed at [facility], [place] [address], [place], [place] [address]    Culture [facility] POSITIVE COCCI  Final   Report  Status PENDING  Incomplete  Blood Culture ID Panel (Reflexed)     Status: Abnormal   Collection Time: 11/28/19  9:19 PM  Result Value Ref Range Status   Enterococcus species NOT DETECTED NOT DETECTED Final   Listeria monocytogenes NOT DETECTED NOT DETECTED Final   Staphylococcus species DETECTED (A) NOT DETECTED Final    Comment: Methicillin (oxacillin) resistant coagulase negative staphylococcus. Possible blood culture contaminant (unless isolated from more than one blood culture draw or clinical case suggests pathogenicity). No antibiotic treatment is indicated for blood  culture contaminants. CRITICAL RESULT CALLED TO, READ BACK BY AND VERIFIED WITH: Jiles Garter [facility] [place] PHARMD 1710 11/29/19 A BROWNING    Staphylococcus aureus (BCID) NOT DETECTED NOT DETECTED Final   Methicillin resistance DETECTED (A) NOT DETECTED Final    Comment: CRITICAL RESULT CALLED TO, READ BACK BY AND VERIFIED WITH: H VON  DOHLEN PHARMD 1710 11/29/19 A BROWNING    Streptococcus species NOT DETECTED NOT DETECTED Final   Streptococcus agalactiae NOT DETECTED NOT DETECTED Final   Streptococcus pneumoniae NOT DETECTED NOT DETECTED Final   Streptococcus pyogenes NOT DETECTED NOT DETECTED Final   Acinetobacter baumannii NOT DETECTED NOT DETECTED Final   Enterobacteriaceae species NOT DETECTED NOT DETECTED Final   Enterobacter cloacae complex NOT DETECTED NOT DETECTED Final   Escherichia coli NOT DETECTED NOT DETECTED Final   Klebsiella oxytoca NOT DETECTED NOT DETECTED Final   Klebsiella pneumoniae NOT DETECTED NOT DETECTED Final   Proteus species NOT DETECTED NOT DETECTED Final   Serratia marcescens NOT DETECTED NOT DETECTED Final   Haemophilus influenzae NOT DETECTED NOT DETECTED Final   Neisseria meningitidis NOT DETECTED NOT DETECTED Final   Pseudomonas aeruginosa NOT DETECTED NOT DETECTED Final   Candida albicans NOT DETECTED NOT DETECTED Final   Candida glabrata NOT DETECTED NOT DETECTED Final   Candida krusei NOT DETECTED NOT DETECTED Final   Candida parapsilosis NOT DETECTED NOT DETECTED Final   Candida tropicalis NOT DETECTED NOT DETECTED Final    Comment: Performed at [facility], [place]. [address] [place], [place] [address]  Respiratory Panel by RT PCR (Flu A&B, Covid) - Throat     Status: Abnormal   Collection Time: 11/28/19  9:21 PM   Specimen: Throat  Result Value Ref Range Status   SARS Coronavirus 2 by RT PCR POSITIVE (A) NEGATIVE Final    Comment: RESULT CALLED TO, READ BACK BY AND VERIFIED WITH: T [facility] RN 11/28/2331 JDW (NOTE) SARS-CoV-2 target nucleic acids are DETECTED. SARS-CoV-2 RNA is generally detectable in upper respiratory specimens  during the acute phase of infection. Positive results are indicative of the presence of the identified virus, but do not rule out bacterial infection or co-infection with other pathogens not detected by the test. Clinical correlation with  patient history and other diagnostic information is necessary to determine patient infection status. The expected result is Negative. Fact Sheet for Patients:  PinkCheek.be Fact Sheet for Healthcare Providers: GravelBags.it This test is not yet approved or cleared by the [place] FDA and  has been authorized for detection and/or diagnosis of SARS-CoV-2 by FDA under an Emergency Use Authorization (EUA).  This EUA will remain in effect (meaning this test can be used) for the d uration of  the COVID-19 declaration under Section 564(b)(1) of the Act, 21 U.S.C. section 360bbb-3(b)(1), unless the authorization is terminated or revoked sooner.    Influenza A by PCR NEGATIVE NEGATIVE Final   Influenza B by PCR NEGATIVE NEGATIVE Final    Comment: (  NOTE) The Xpert Xpress SARS-CoV-2/FLU/RSV assay is intended as an aid in  the diagnosis of influenza from Nasopharyngeal swab specimens and  should not be used as a sole basis for treatment. Nasal washings and  aspirates are unacceptable for Xpert Xpress SARS-CoV-2/FLU/RSV  testing. Fact Sheet for Patients: PinkCheek.be Fact Sheet for Healthcare Providers: GravelBags.it This test is not yet approved or cleared by the [place] FDA and  has been authorized for detection and/or diagnosis of SARS-CoV-2 by  FDA under an Emergency Use Authorization (EUA). This EUA will remain  in effect (meaning this test can be used) for the duration of the  Covid-19 declaration under Section 564(b)(1) of the Act, 21  U.S.C. section 360bbb-3(b)(1), unless the authorization is  terminated or revoked. Performed at [facility], [place] [address] [place], [place] [address]   Group A Strep by PCR     Status: None   Collection Time: 11/28/19  9:22 PM   Specimen: Throat; Sterile Swab  Result Value Ref Range Status   Group A Strep by PCR NOT  DETECTED NOT DETECTED Final    Comment: Performed at [facility], [place] [address], [place], [place] [address]  Urine culture     Status: Abnormal   Collection Time: 11/28/19  9:56 PM   Specimen: Urine, Clean Catch  Result Value Ref Range Status   Specimen Description URINE, CLEAN CATCH  Final   Special Requests   Final    NONE Performed at [facility], [place] [address], [place], [place] [address]    Culture MULTIPLE SPECIES PRESENT, SUGGEST RECOLLECTION (A)  Final   Report Status 11/29/2019 FINAL  Final    Radiology Reports DG Chest Port 1 View  Result Date: 11/28/2019 CLINICAL DATA:  Weakness and hypotensive. EXAM: PORTABLE CHEST 1 VIEW COMPARISON:  November 06, 2003 FINDINGS: There is no evidence of acute infiltrate, pleural effusion or pneumothorax. A 1.0 cm calcified nodular opacity is seen overlying the medial aspect of the left lung base. This represents a new finding when compared to the prior study. The heart size and mediastinal contours are within normal limits. The visualized skeletal structures are unremarkable. IMPRESSION: No active disease. Electronically Signed   By: Virgina Norfolk M.D.   On: 11/28/2019 21:03

## 2019-12-01 DIAGNOSIS — J1282 Pneumonia due to coronavirus disease 2019: Secondary | ICD-10-CM

## 2019-12-01 DIAGNOSIS — F259 Schizoaffective disorder, unspecified: Secondary | ICD-10-CM | POA: Diagnosis not present

## 2019-12-01 DIAGNOSIS — U071 COVID-19: Secondary | ICD-10-CM | POA: Diagnosis not present

## 2019-12-01 DIAGNOSIS — E118 Type 2 diabetes mellitus with unspecified complications: Secondary | ICD-10-CM | POA: Diagnosis not present

## 2019-12-01 DIAGNOSIS — E162 Hypoglycemia, unspecified: Secondary | ICD-10-CM | POA: Diagnosis not present

## 2019-12-01 LAB — COMPREHENSIVE METABOLIC PANEL
ALT: 38 U/L (ref 0–44)
AST: 29 U/L (ref 15–41)
Albumin: 2.6 g/dL — ABNORMAL LOW (ref 3.5–5.0)
Alkaline Phosphatase: 61 U/L (ref 38–126)
Anion gap: 11 (ref 5–15)
BUN: 10 mg/dL (ref 6–20)
CO2: 22 mmol/L (ref 22–32)
Calcium: 9.7 mg/dL (ref 8.9–10.3)
Chloride: 105 mmol/L (ref 98–111)
Creatinine, Ser: 0.91 mg/dL (ref 0.44–1.00)
GFR calc Af Amer: >60 mL/min (ref >60)
GFR calc non Af Amer: >60 mL/min (ref >60)
Glucose, Bld: 188 mg/dL — ABNORMAL HIGH (ref 70–99)
Potassium: 3.8 mmol/L (ref 3.5–5.1)
Sodium: 138 mmol/L (ref 135–145)
Total Bilirubin: 0.5 mg/dL (ref 0.3–1.2)
Total Protein: 8.1 g/dL (ref 6.5–8.1)

## 2019-12-01 LAB — CULTURE, BLOOD (ROUTINE X 2)
Special Requests: ADEQUATE
Special Requests: ADEQUATE

## 2019-12-01 LAB — CBC
HCT: 34.8 % — ABNORMAL LOW (ref 36.0–46.0)
Hemoglobin: 10.6 g/dL — ABNORMAL LOW (ref 12.0–15.0)
MCH: 24.7 pg — ABNORMAL LOW (ref 26.0–34.0)
MCHC: 30.5 g/dL (ref 30.0–36.0)
MCV: 81.1 fL (ref 80.0–100.0)
Platelets: 320 10*3/uL (ref 150–400)
RBC: 4.29 MIL/uL (ref 3.87–5.11)
RDW: 15.8 % — ABNORMAL HIGH (ref 11.5–15.5)
WBC: 6.3 10*3/uL (ref 4.0–10.5)
nRBC: 0 % (ref 0.0–0.2)

## 2019-12-01 LAB — C-REACTIVE PROTEIN: CRP: 13.8 mg/dL — ABNORMAL HIGH (ref <1.0)

## 2019-12-01 LAB — FERRITIN: Ferritin: 100 ng/mL (ref 11–307)

## 2019-12-01 LAB — D-DIMER, QUANTITATIVE: D-Dimer, Quant: 0.62 ug/mL-FEU — ABNORMAL HIGH (ref 0.00–0.50)

## 2019-12-01 LAB — GLUCOSE, CAPILLARY
Glucose-Capillary: 235 mg/dL — ABNORMAL HIGH (ref 70–99)
Glucose-Capillary: 237 mg/dL — ABNORMAL HIGH (ref 70–99)

## 2019-12-01 MED ORDER — METHYLPREDNISOLONE SODIUM SUCC 125 MG IJ SOLR: 125.0000 mg | Freq: Once | INTRAMUSCULAR | Status: DC | PRN

## 2019-12-01 MED ORDER — FAMOTIDINE IN NACL 20-0.9 MG/50ML-% IV SOLN: 20.0000 mg | Freq: Once | INTRAVENOUS | Status: DC | PRN

## 2019-12-01 MED ORDER — HYDROCOD POLST-CPM POLST ER 10-8 MG/5ML PO SUER: 5.0000 mL | Freq: Two times a day (BID) | ORAL | 0 refills | Status: DC | PRN

## 2019-12-01 MED ORDER — SODIUM CHLORIDE 0.9 % IV SOLN: 100.0000 mg | Freq: Once | INTRAVENOUS | Status: DC

## 2019-12-01 MED ORDER — ALBUTEROL SULFATE HFA 108 (90 BASE) MCG/ACT IN AERS: 2.0000 | INHALATION_SPRAY | Freq: Once | RESPIRATORY_TRACT | Status: DC | PRN

## 2019-12-01 MED ORDER — GUAIFENESIN-DM 100-10 MG/5ML PO SYRP: 10.0000 mL | ORAL_SOLUTION | ORAL | 0 refills | Status: DC | PRN

## 2019-12-01 MED ORDER — EPINEPHRINE 0.3 MG/0.3ML IJ SOAJ: 0.3000 mg | Freq: Once | INTRAMUSCULAR | Status: DC | PRN

## 2019-12-01 MED ORDER — SODIUM CHLORIDE 0.9 % IV SOLN
INTRAVENOUS | Status: DC | PRN
Start: 2019-12-01 — End: 2019-12-01

## 2019-12-01 MED ORDER — DIPHENHYDRAMINE HCL 50 MG/ML IJ SOLN: 50.0000 mg | Freq: Once | INTRAMUSCULAR | Status: DC | PRN

## 2019-12-01 MED ORDER — SODIUM CHLORIDE 0.9 % IV SOLN: INTRAVENOUS | Status: DC | PRN

## 2019-12-01 NOTE — Discharge Instructions (Addendum)
You are scheduled for an outpatient infusion of Remdesivir at 8:30 AM on Friday 4/30 and Saturday 5/1.  Please report to [facility] at [address]  Drive to the security guard and tell them you are here for an infusion. They will direct you to the front entrance where we will come and get you.  For questions call [phone].  Thanks      Person Under Monitoring Name: Tracy Vaughn  Location: [place] [address]   Infection Prevention Recommendations for Individuals Confirmed to have, or Being Evaluated for, 2019 Novel Coronavirus (COVID-19) Infection Who Receive Care at Home  Individuals who are confirmed to have, or are being evaluated for, COVID-19 should follow the prevention steps below until a healthcare provider or local or state health department says they can return to normal activities.  Stay home except to get medical care You should restrict activities outside your home, except for getting medical care. Do not go to work, school, or public areas, and do not use public transportation or taxis.  Call ahead before visiting your doctor Before your medical appointment, call the healthcare provider and tell them that you have, or are being evaluated for, COVID-19 infection. This will help the healthcare provider's office take steps to keep other people from getting infected. Ask your healthcare provider to call the local or state health department.  Monitor your symptoms Seek prompt medical attention if your illness is worsening (e.g., difficulty breathing). Before going to your medical appointment, call the healthcare provider and tell them that you have, or are being evaluated for, COVID-19 infection. Ask your healthcare provider to call the local or state health department.  Wear a facemask You should wear a facemask that covers your nose and mouth when you are in the same room with other people and when you visit a healthcare provider.  People who live with or visit you should also wear a facemask while they are in the same room with you.  Separate yourself from other people in your home As much as possible, you should stay in a different room from other people in your home. Also, you should use a separate bathroom, if available.  Avoid sharing household items You should not share dishes, drinking glasses, cups, eating utensils, towels, bedding, or other items with other people in your home. After using these items, you should wash them thoroughly with soap and water.  Cover your coughs and sneezes Cover your mouth and nose with a tissue when you cough or sneeze, or you can cough or sneeze into your sleeve. Throw used tissues in a lined trash can, and immediately wash your hands with soap and water for at least 20 seconds or use an alcohol-based hand rub.  Wash your [facility] your hands often and thoroughly with soap and water for at least 20 seconds. You can use an alcohol-based hand sanitizer if soap and water are not available and if your hands are not visibly dirty. Avoid touching your eyes, nose, and mouth with unwashed hands.   Prevention Steps for Caregivers and Household Members of Individuals Confirmed to have, or Being Evaluated for, COVID-19 Infection Being Cared for in the Home  If you live with, or provide care at home for, a person confirmed to have, or being evaluated for, COVID-19 infection please follow these guidelines to prevent infection:  Follow healthcare provider's instructions Make sure that you understand and can help the patient follow any healthcare provider instructions  for all care.  Provide for the patient's basic needs You should help the patient with basic needs in the home and provide support for getting groceries, prescriptions, and other personal needs.  Monitor the patient's symptoms If they are getting sicker, call his or her medical provider and tell them that the patient  has, or is being evaluated for, COVID-19 infection. This will help the healthcare provider's office take steps to keep other people from getting infected. Ask the healthcare provider to call the local or state health department.  Limit the number of people who have contact with the patient  If possible, have only one caregiver for the patient.  Other household members should stay in another home or place of residence. If this is not possible, they should stay  in another room, or be separated from the patient as much as possible. Use a separate bathroom, if available.  Restrict visitors who do not have an essential need to be in the home.  Keep older adults, very young children, and other sick people away from the patient Keep older adults, very young children, and those who have compromised immune systems or chronic health conditions away from the patient. This includes people with chronic heart, lung, or kidney conditions, diabetes, and cancer.  Ensure good ventilation Make sure that shared spaces in the home have good air flow, such as from an air conditioner or an opened window, weather permitting.  Wash your hands often  Wash your hands often and thoroughly with soap and water for at least 20 seconds. You can use an alcohol based hand sanitizer if soap and water are not available and if your hands are not visibly dirty.  Avoid touching your eyes, nose, and mouth with unwashed hands.  Use disposable paper towels to dry your hands. If not available, use dedicated cloth towels and replace them when they become wet.  Wear a facemask and gloves  Wear a disposable facemask at all times in the room and gloves when you touch or have contact with the patient's blood, body fluids, and/or secretions or excretions, such as sweat, saliva, sputum, nasal mucus, vomit, urine, or feces.  Ensure the mask fits over your nose and mouth tightly, and do not touch it during use.  Throw out disposable  facemasks and gloves after using them. Do not reuse.  Wash your hands immediately after removing your facemask and gloves.  If your personal clothing becomes contaminated, carefully remove clothing and launder. Wash your hands after handling contaminated clothing.  Place all used disposable facemasks, gloves, and other waste in a lined container before disposing them with other household waste.  Remove gloves and wash your hands immediately after handling these items.  Do not share dishes, glasses, or other household items with the patient  Avoid sharing household items. You should not share dishes, drinking glasses, cups, eating utensils, towels, bedding, or other items with a patient who is confirmed to have, or being evaluated for, COVID-19 infection.  After the person uses these items, you should wash them thoroughly with soap and water.  Wash laundry thoroughly  Immediately remove and wash clothes or bedding that have blood, body fluids, and/or secretions or excretions, such as sweat, saliva, sputum, nasal mucus, vomit, urine, or feces, on them.  Wear gloves when handling laundry from the patient.  Read and follow directions on labels of laundry or clothing items and detergent. In general, wash and dry with the warmest temperatures recommended on the label.  Clean  all areas the individual has used often  Clean all touchable surfaces, such as counters, tabletops, doorknobs, bathroom fixtures, toilets, phones, keyboards, tablets, and bedside tables, every day. Also, clean any surfaces that may have blood, body fluids, and/or secretions or excretions on them.  Wear gloves when cleaning surfaces the patient has come in contact with.  Use a diluted bleach solution (e.g., dilute bleach with 1 part bleach and 10 parts water) or a household disinfectant with a label that says EPA-registered for coronaviruses. To make a bleach solution at home, add 1 tablespoon of bleach to 1 quart (4 cups)  of water. For a larger supply, add  cup of bleach to 1 gallon (16 cups) of water.  Read labels of cleaning products and follow recommendations provided on product labels. Labels contain instructions for safe and effective use of the cleaning product including precautions you should take when applying the product, such as wearing gloves or eye protection and making sure you have good ventilation during use of the product.  Remove gloves and wash hands immediately after cleaning.  Monitor yourself for signs and symptoms of illness Caregivers and household members are considered close contacts, should monitor their health, and will be asked to limit movement outside of the home to the extent possible. Follow the monitoring steps for close contacts listed on the symptom monitoring form.   ? If you have additional questions, contact your local health department or call the epidemiologist on call at [phone] (available 24/7). ? This guidance is subject to change. For the most up-to-date guidance from [facility], please refer to their website: YouBlogs.pl

## 2019-12-01 NOTE — TOC Transition Note (Signed)
Transition of Care [facility]) - CM/SW Discharge Note   Patient Details  Name: Tracy Vaughn MRN: [id number] Date of Birth: 1975/11/08  Transition of Care [facility]) CM/SW Contact:  Pollie Friar, RN Phone Number: 12/01/2019, 10:49 AM   Clinical Narrative:    Pt discharging home with self care. Pt has hospital f/u, insurance and transportation home.    Final next level of care: Home/Self Care Barriers to Discharge: No Barriers Identified   Patient Goals and CMS Choice        Discharge Placement                       Discharge Plan and Services                                     Social Determinants of Health (SDOH) Interventions     Readmission Risk Interventions No flowsheet data found.

## 2019-12-01 NOTE — Progress Notes (Signed)
Patient remained alert and oriented on room air, ambulates to bathroom independently,. Vital sign stable. No acute distress noted.

## 2019-12-01 NOTE — Progress Notes (Signed)
Patient scheduled for outpatient Remdesivir infusion at 8:30 AM on Friday 4/30 and Saturday 5/1.  Please advise them to report to [facility] at [address].  Drive to the security guard and tell them you are here for an infusion. They will direct you to the front entrance where we will come and get you.  For questions call [phone].  Thanks

## 2019-12-01 NOTE — Discharge Summary (Signed)
PATIENT DETAILS Name: Tracy Vaughn Age: 44 y.o. Sex: female Date of Birth: 1976-04-25 MRN: [id number]. Admitting Physician: Albertine Patricia, MD [id number], Iona Beard, MD  Admit Date: 11/28/2019 Discharge date: 12/01/2019  Recommendations for Outpatient Follow-up:  1. Follow up with PCP in 1-2 weeks 2. Please obtain CMP/CBC in one week 3. Repeat Chest Xray in 4-6 week 4. Has calcified nodular opacity in the lung-defer further work-up to PCP  Admitted From:  Home  Disposition: [place]: No  Equipment/Devices: None  Discharge Condition: Stable  CODE STATUS: FULL CODE  Diet recommendation:  Diet Order            Diet Carb Modified        Diet - low sodium heart healthy        Diet Carb Modified Fluid consistency: Thin; Room service appropriate? Yes  Diet effective now               Brief Narrative: Patient is a 44 y.o. female with PMHx of DM-2, schizoaffective disorder-presented with 1 week history of fever, body aches, fatigue, cough-found to have COVID-19 and hypoglycemia.  Significant Events: 4/26>> admit for COVID-19 and hypoglycemia  COVID-19 medications: Remdesivir: 4/26>>  Antibiotics: None  Microbiology data: 4/26: Blood culture>>  coag negative staph 4/26: Urine culture>> multiple species  Brief Hospital Course:  COVID-19 pneumonia: Improved-not on oxygen since admission-cough improving-CRP downtrending-feels much better and wants to go home.  Since improved-not hypoxic-CRP decreasing-stable to be discharged home today to continue remdesivir infusion in the outpatient center.    COVID-19 Labs:  Recent Labs    11/29/19 0016 11/30/19 0741 12/01/19 0458  DDIMER 0.79* 0.80* 0.62*  FERRITIN  --   --  100  CRP 7.1* 15.0* 13.8*    Lab Results  Component Value Date   SARSCOV2NAA POSITIVE (A) 11/28/2019     Coag negative staph bacteremia: Likely contaminant-not treated-doubt further work-up required.  DM-2 (A1c 6.9 on  4/28) with hypoglycemia:  Hypoglycemia likely secondary to decreased oral intake in the setting of COVID-19-CBGs now stable-resume Metformin and follow with PCP.  Doubt Metformin the etiology of hypoglycemia  HTN: BP stable-resume usual antihypertensives on discharge  1cm calcified nodal nodular opacity seen in the left lung: We will need to repeat imaging in the outpatient setting at the discretion of her PCP.  Schizoaffective disorder: Remains stable-continue cariprazine, lithium, Effexor and benztropine.  Obesity: Estimated body mass index is 48.42 kg/m as calculated from the following:   Height as of 03/29/19: 5\' 6"  (1.676 m).   Weight as of 03/29/19: 136.1 kg.    Discharge Diagnoses:  Principal Problem:   Hypoglycemia Active Problems:   Type 2 diabetes mellitus with complication, without long-term current use of insulin (HCC)   Schizoaffective disorder ([place])   COVID-19 virus infection   Discharge Instructions:    Person Under Monitoring Name: Tracy Vaughn  Location: [place] [address]   Infection Prevention Recommendations for Individuals Confirmed to have, or Being Evaluated for, 2019 Novel Coronavirus (COVID-19) Infection Who Receive Care at Home  Individuals who are confirmed to have, or are being evaluated for, COVID-19 should follow the prevention steps below until a healthcare provider or local or state health department says they can return to normal activities.  Stay home except to get medical care You should restrict activities outside your home, except for getting medical care. Do not go to work, school, or public areas, and do not use public transportation or taxis.  Call ahead before visiting your doctor Before your medical appointment, call the healthcare provider and tell them that you have, or are being evaluated for, COVID-19 infection. This will help the healthcare provider's office take steps to keep other people from getting  infected. Ask your healthcare provider to call the local or state health department.  Monitor your symptoms Seek prompt medical attention if your illness is worsening (e.g., difficulty breathing). Before going to your medical appointment, call the healthcare provider and tell them that you have, or are being evaluated for, COVID-19 infection. Ask your healthcare provider to call the local or state health department.  Wear a facemask You should wear a facemask that covers your nose and mouth when you are in the same room with other people and when you visit a healthcare provider. People who live with or visit you should also wear a facemask while they are in the same room with you.  Separate yourself from other people in your home As much as possible, you should stay in a different room from other people in your home. Also, you should use a separate bathroom, if available.  Avoid sharing household items You should not share dishes, drinking glasses, cups, eating utensils, towels, bedding, or other items with other people in your home. After using these items, you should wash them thoroughly with soap and water.  Cover your coughs and sneezes Cover your mouth and nose with a tissue when you cough or sneeze, or you can cough or sneeze into your sleeve. Throw used tissues in a lined trash can, and immediately wash your hands with soap and water for at least 20 seconds or use an alcohol-based hand rub.  Wash your [facility] your hands often and thoroughly with soap and water for at least 20 seconds. You can use an alcohol-based hand sanitizer if soap and water are not available and if your hands are not visibly dirty. Avoid touching your eyes, nose, and mouth with unwashed hands.   Prevention Steps for Caregivers and Household Members of Individuals Confirmed to have, or Being Evaluated for, COVID-19 Infection Being Cared for in the Home  If you live with, or provide care at home for, a  person confirmed to have, or being evaluated for, COVID-19 infection please follow these guidelines to prevent infection:  Follow healthcare provider's instructions Make sure that you understand and can help the patient follow any healthcare provider instructions for all care.  Provide for the patient's basic needs You should help the patient with basic needs in the home and provide support for getting groceries, prescriptions, and other personal needs.  Monitor the patient's symptoms If they are getting sicker, call his or her medical provider and tell them that the patient has, or is being evaluated for, COVID-19 infection. This will help the healthcare provider's office take steps to keep other people from getting infected. Ask the healthcare provider to call the local or state health department.  Limit the number of people who have contact with the patient  If possible, have only one caregiver for the patient.  Other household members should stay in another home or place of residence. If this is not possible, they should stay  in another room, or be separated from the patient as much as possible. Use a separate bathroom, if available.  Restrict visitors who do not have an essential need to be in the home.  Keep older adults, very young children, and other sick people away from the patient  Keep older adults, very young children, and those who have compromised immune systems or chronic health conditions away from the patient. This includes people with chronic heart, lung, or kidney conditions, diabetes, and cancer.  Ensure good ventilation Make sure that shared spaces in the home have good air flow, such as from an air conditioner or an opened window, weather permitting.  Wash your hands often  Wash your hands often and thoroughly with soap and water for at least 20 seconds. You can use an alcohol based hand sanitizer if soap and water are not available and if your hands are not  visibly dirty.  Avoid touching your eyes, nose, and mouth with unwashed hands.  Use disposable paper towels to dry your hands. If not available, use dedicated cloth towels and replace them when they become wet.  Wear a facemask and gloves  Wear a disposable facemask at all times in the room and gloves when you touch or have contact with the patient's blood, body fluids, and/or secretions or excretions, such as sweat, saliva, sputum, nasal mucus, vomit, urine, or feces.  Ensure the mask fits over your nose and mouth tightly, and do not touch it during use.  Throw out disposable facemasks and gloves after using them. Do not reuse.  Wash your hands immediately after removing your facemask and gloves.  If your personal clothing becomes contaminated, carefully remove clothing and launder. Wash your hands after handling contaminated clothing.  Place all used disposable facemasks, gloves, and other waste in a lined container before disposing them with other household waste.  Remove gloves and wash your hands immediately after handling these items.  Do not share dishes, glasses, or other household items with the patient  Avoid sharing household items. You should not share dishes, drinking glasses, cups, eating utensils, towels, bedding, or other items with a patient who is confirmed to have, or being evaluated for, COVID-19 infection.  After the person uses these items, you should wash them thoroughly with soap and water.  Wash laundry thoroughly  Immediately remove and wash clothes or bedding that have blood, body fluids, and/or secretions or excretions, such as sweat, saliva, sputum, nasal mucus, vomit, urine, or feces, on them.  Wear gloves when handling laundry from the patient.  Read and follow directions on labels of laundry or clothing items and detergent. In general, wash and dry with the warmest temperatures recommended on the label.  Clean all areas the individual has used  often  Clean all touchable surfaces, such as counters, tabletops, doorknobs, bathroom fixtures, toilets, phones, keyboards, tablets, and bedside tables, every day. Also, clean any surfaces that may have blood, body fluids, and/or secretions or excretions on them.  Wear gloves when cleaning surfaces the patient has come in contact with.  Use a diluted bleach solution (e.g., dilute bleach with 1 part bleach and 10 parts water) or a household disinfectant with a label that says EPA-registered for coronaviruses. To make a bleach solution at home, add 1 tablespoon of bleach to 1 quart (4 cups) of water. For a larger supply, add  cup of bleach to 1 gallon (16 cups) of water.  Read labels of cleaning products and follow recommendations provided on product labels. Labels contain instructions for safe and effective use of the cleaning product including precautions you should take when applying the product, such as wearing gloves or eye protection and making sure you have good ventilation during use of the product.  Remove gloves and wash hands immediately after cleaning.  Monitor yourself for signs and symptoms of illness Caregivers and household members are considered close contacts, should monitor their health, and will be asked to limit movement outside of the home to the extent possible. Follow the monitoring steps for close contacts listed on the symptom monitoring form.   ? If you have additional questions, contact your local health department or call the epidemiologist on call at [phone] (available 24/7). ? This guidance is subject to change. For the most up-to-date guidance from CDC, please refer to their website: YouBlogs.pl    Activity:  As tolerated   Discharge Instructions    Call MD for:  difficulty breathing, headache or visual disturbances   Complete by: As directed    Call MD for:  extreme fatigue   Complete by:  As directed    Call MD for:  persistant dizziness or light-headedness   Complete by: As directed    Call MD for:  persistant nausea and vomiting   Complete by: As directed    Diet - low sodium heart healthy   Complete by: As directed    Diet Carb Modified   Complete by: As directed    Discharge instructions   Complete by: As directed    Follow with Primary MD  Benito Mccreedy, MD in 1-2 weeks  Please ask your primary care MD-to evaluate a calcified nodular density in your left lung-you may need further work-up.  Please get a complete blood count and chemistry panel checked by your Primary MD at your next visit, and again as instructed by your Primary MD.  Get Medicines reviewed and adjusted: Please take all your medications with you for your next visit with your Primary MD  Laboratory/radiological data: Please request your Primary MD to go over all hospital tests and procedure/radiological results at the follow up, please ask your Primary MD to get all Hospital records sent to his/her office.  In some cases, they will be blood work, cultures and biopsy results pending at the time of your discharge. Please request that your primary care M.D. follows up on these results.  Also Note the following: If you experience worsening of your admission symptoms, develop shortness of breath, life threatening emergency, suicidal or homicidal thoughts you must seek medical attention immediately by calling 911 or calling your MD immediately  if symptoms less severe.  You must read complete instructions/literature along with all the possible adverse reactions/side effects for all the Medicines you take and that have been prescribed to you. Take any new Medicines after you have completely understood and accpet all the possible adverse reactions/side effects.   Do not drive when taking Pain medications or sleeping medications (Benzodaizepines)  Do not take more than prescribed Pain, Sleep and Anxiety  Medications. It is not advisable to combine anxiety,sleep and pain medications without talking with your primary care practitioner  Special Instructions: If you have smoked or chewed Tobacco  in the last 2 yrs please stop smoking, stop any regular Alcohol  and or any Recreational drug use.  Wear Seat belts while driving.  Please note: You were cared for by a hospitalist during your hospital stay. Once you are discharged, your primary care physician will handle any further medical issues. Please note that NO REFILLS for any discharge medications will be authorized once you are discharged, as it is imperative that you return to your primary care physician (or establish a relationship with a primary care physician if you do not have one) for your post hospital discharge  needs so that they can reassess your need for medications and monitor your lab values.   1. 3 weeks of isolation from 11/28/19  2. Repeat Chest xray in 6-8 weeks-please ask your primary care MD  3. You are scheduled for an outpatient infusion of Remdesivir at 8:30 AM on Friday 4/30 and Saturday 5/1.  Please report to [facility] at [address].  Drive to the security guard and tell them you are here for an infusion. They will direct you to the front entrance where we will come and get you.  For questions call [phone].   Increase activity slowly   Complete by: As directed      Allergies as of 12/01/2019      Reactions   Fanapt [iloperidone]    Unknown, but takes the cogentin to fix the side effects   Lithium    Takes cogentin to adverse reaction to medication   Novocain [procaine] Rash   bumps      Medication List    TAKE these medications   benztropine 2 MG tablet Commonly known as: COGENTIN Take 1 tablet (2 mg total) by mouth at bedtime.   chlorpheniramine-HYDROcodone 10-8 MG/5ML Suer Commonly known as: TUSSIONEX Take 5 mLs by mouth every 12 (twelve) hours as needed for cough.   gabapentin 600 MG  tablet Commonly known as: NEURONTIN Take 600 mg by mouth in the morning and at bedtime.   guaiFENesin-dextromethorphan 100-10 MG/5ML syrup Commonly known as: ROBITUSSIN DM Take 10 mLs by mouth every 4 (four) hours as needed for cough.   lisinopril-hydrochlorothiazide 20-25 MG tablet Commonly known as: ZESTORETIC Take 1 tablet by mouth daily.   lithium carbonate 300 MG capsule Take 300-600 mg by mouth See admin instructions. Take 1 capsule in the morning and 2 capsule at bedtime.   metFORMIN 1000 MG tablet Commonly known as: GLUCOPHAGE Take 1,000 mg by mouth 2 (two) times daily.   omeprazole 20 MG capsule Commonly known as: PRILOSEC Take 20 mg by mouth daily.   venlafaxine XR 75 MG 24 hr capsule Commonly known as: EFFEXOR-XR Take 75 mg by mouth daily.   Vitamin D3 125 MCG (5000 UT) Caps Take by mouth.   Vraylar capsule Generic drug: cariprazine Take 1.5 mg by mouth daily.      Follow-up Information    Osei-Bonsu, Iona Beard, MD. Schedule an appointment as soon as possible for a visit in 1 week(s).   Specialty: Internal Medicine Contact information: [id number] [place] [address] [phone]          Allergies  Allergen Reactions  . Fanapt [Iloperidone]     Unknown, but takes the cogentin to fix the side effects  . Lithium     Takes cogentin to adverse reaction to medication  . Novocain [Procaine] Rash    bumps     Other Procedures/Studies: DG Chest Port 1 View  Result Date: 11/28/2019 CLINICAL DATA:  Weakness and hypotensive. EXAM: PORTABLE CHEST 1 VIEW COMPARISON:  November 06, 2003 FINDINGS: There is no evidence of acute infiltrate, pleural effusion or pneumothorax. A 1.0 cm calcified nodular opacity is seen overlying the medial aspect of the left lung base. This represents a new finding when compared to the prior study. The heart size and mediastinal contours are within normal limits. The visualized skeletal structures are unremarkable.  IMPRESSION: No active disease. Electronically Signed   By: Virgina Norfolk M.D.   On: 11/28/2019 21:03   DG Chest Port 1V  same Day  Result Date: 11/30/2019 CLINICAL DATA:  Show shortness of breath and hypoglycemia. Tachycardia. EXAM: PORTABLE CHEST 1 VIEW COMPARISON:  November 28, 2019 FINDINGS: There is ill-defined airspace opacity in the right base. Lungs elsewhere are clear. Heart is upper normal in size with pulmonary vascularity normal. No adenopathy. No bone lesions. IMPRESSION: Ill-defined opacity right base consistent with pneumonia. Lungs elsewhere clear. Heart upper normal in size. No adenopathy. Electronically Signed   By: Lowella Grip III M.D.   On: 11/30/2019 10:55     TODAY-DAY OF DISCHARGE:  Subjective:   [name] today has no headache,no chest abdominal pain,no new weakness tingling or numbness, feels much better wants to go home today.   Objective:   Blood pressure 109/76, pulse (!) 102, temperature 98.3 F (36.8 C), temperature source Oral, resp. rate 19, SpO2 99 %.  Intake/Output Summary (Last 24 hours) at 12/01/2019 1013 Last data filed at 11/30/2019 1600 Gross per 24 hour  Intake 340 ml  Output --  Net 340 ml   There were no vitals filed for this visit.  Exam: Awake Alert, Oriented *3, No new F.N deficits, Normal affect [place].AT,PERRAL Supple Neck,No JVD, No cervical lymphadenopathy appriciated.  Symmetrical Chest wall movement, Good air movement bilaterally, CTAB RRR,No Gallops,Rubs or new Murmurs, No Parasternal Heave +ve B.Sounds, Abd Soft, Non tender, No organomegaly appriciated, No rebound -guarding or rigidity. No Cyanosis, Clubbing or edema, No new Rash or bruise   PERTINENT RADIOLOGIC STUDIES: DG Chest Port 1 View  Result Date: 11/28/2019 CLINICAL DATA:  Weakness and hypotensive. EXAM: PORTABLE CHEST 1 VIEW COMPARISON:  November 06, 2003 FINDINGS: There is no evidence of acute infiltrate, pleural effusion or pneumothorax. A 1.0 cm calcified nodular  opacity is seen overlying the medial aspect of the left lung base. This represents a new finding when compared to the prior study. The heart size and mediastinal contours are within normal limits. The visualized skeletal structures are unremarkable. IMPRESSION: No active disease. Electronically Signed   By: Virgina Norfolk M.D.   On: 11/28/2019 21:03   DG Chest Port 1V same Day  Result Date: 11/30/2019 CLINICAL DATA:  Show shortness of breath and hypoglycemia. Tachycardia. EXAM: PORTABLE CHEST 1 VIEW COMPARISON:  November 28, 2019 FINDINGS: There is ill-defined airspace opacity in the right base. Lungs elsewhere are clear. Heart is upper normal in size with pulmonary vascularity normal. No adenopathy. No bone lesions. IMPRESSION: Ill-defined opacity right base consistent with pneumonia. Lungs elsewhere clear. Heart upper normal in size. No adenopathy. Electronically Signed   By: Lowella Grip III M.D.   On: 11/30/2019 10:55     PERTINENT LAB RESULTS: CBC: Recent Labs    11/30/19 0741 12/01/19 0458  WBC 5.6 6.3  HGB 10.2* 10.6*  HCT 34.4* 34.8*  PLT 262 320   CMET CMP     Component Value Date/Time   NA 138 12/01/2019 0458   K 3.8 12/01/2019 0458   CL 105 12/01/2019 0458   CO2 22 12/01/2019 0458   GLUCOSE 188 (H) 12/01/2019 0458   BUN 10 12/01/2019 0458   CREATININE 0.91 12/01/2019 0458   CALCIUM 9.7 12/01/2019 0458   PROT 8.1 12/01/2019 0458   ALBUMIN 2.6 (L) 12/01/2019 0458   AST 29 12/01/2019 0458   ALT 38 12/01/2019 0458   ALKPHOS 61 12/01/2019 0458   BILITOT 0.5 12/01/2019 0458   GFRNONAA >60 12/01/2019 0458   GFRAA >60 12/01/2019 0458    GFR CrCl cannot be calculated (Unknown ideal weight.).  Recent Labs    11/28/19 2119  LIPASE 29   No results for input(s): CKTOTAL, CKMB, CKMBINDEX, TROPONINI in the last 72 hours. Invalid input(s): POCBNP Recent Labs    11/30/19 0741 12/01/19 0458  DDIMER 0.80* 0.62*   Recent Labs    11/30/19 0741  HGBA1C 6.9*   No  results for input(s): CHOL, HDL, LDLCALC, TRIG, CHOLHDL, LDLDIRECT in the last 72 hours. Recent Labs    11/28/19 2119  TSH 0.908   Recent Labs    12/01/19 0458  FERRITIN 100   Coags: Recent Labs    11/28/19 2119  INR 1.0   Microbiology: Recent Results (from the past 240 hour(s))  Culture, blood (routine x 2)     Status: Abnormal   Collection Time: 11/28/19  9:19 PM   Specimen: BLOOD  Result Value Ref Range Status   Specimen Description BLOOD LEFT ANTECUBITAL  Final   Special Requests   Final    BOTTLES DRAWN AEROBIC AND ANAEROBIC Blood Culture adequate volume   Culture  Setup Time   Final    GRAM POSITIVE COCCI IN CLUSTERS AEROBIC BOTTLE ONLY Organism ID to follow CRITICAL RESULT CALLED TO, READ BACK BY AND VERIFIED WITH: H VON DOHLEN [id number] 11/29/19 A BROWNING    Culture (A)  Final    STAPHYLOCOCCUS SPECIES (COAGULASE NEGATIVE) THE SIGNIFICANCE OF ISOLATING THIS ORGANISM FROM A SINGLE SET OF BLOOD CULTURES WHEN MULTIPLE SETS ARE DRAWN IS UNCERTAIN. PLEASE NOTIFY THE MICROBIOLOGY DEPARTMENT WITHIN ONE WEEK IF SPECIATION AND SENSITIVITIES ARE REQUIRED. Performed at [facility], [place] [address], [place], [place] [address]    Report Status 12/01/2019 FINAL  Final  Culture, blood (routine x 2)     Status: Abnormal   Collection Time: 11/28/19  9:19 PM   Specimen: BLOOD  Result Value Ref Range Status   Specimen Description BLOOD RIGHT ANTECUBITAL  Final   Special Requests   Final    BOTTLES DRAWN AEROBIC AND ANAEROBIC Blood Culture adequate volume   Culture  Setup Time   Final    IN BOTH AEROBIC AND ANAEROBIC BOTTLES GRAM POSITIVE COCCI IN CLUSTERS CRITICAL VALUE NOTED.  VALUE IS CONSISTENT WITH PREVIOUSLY REPORTED AND CALLED VALUE.    Culture (A)  Final    STAPHYLOCOCCUS SPECIES (COAGULASE NEGATIVE) THE SIGNIFICANCE OF ISOLATING THIS ORGANISM FROM A SINGLE SET OF BLOOD CULTURES WHEN MULTIPLE SETS ARE DRAWN IS UNCERTAIN. PLEASE NOTIFY THE MICROBIOLOGY DEPARTMENT  WITHIN ONE WEEK IF SPECIATION AND SENSITIVITIES ARE REQUIRED. Performed at [facility], [place] [address], [place], [place] [address]    Report Status 12/01/2019 FINAL  Final  Blood Culture ID Panel (Reflexed)     Status: Abnormal   Collection Time: 11/28/19  9:19 PM  Result Value Ref Range Status   Enterococcus species NOT DETECTED NOT DETECTED Final   Listeria monocytogenes NOT DETECTED NOT DETECTED Final   Staphylococcus species DETECTED (A) NOT DETECTED Final    Comment: Methicillin (oxacillin) resistant coagulase negative staphylococcus. Possible blood culture contaminant (unless isolated from more than one blood culture draw or clinical case suggests pathogenicity). No antibiotic treatment is indicated for blood  culture contaminants. CRITICAL RESULT CALLED TO, READ BACK BY AND VERIFIED WITHJiles Garter [facility] PHARMD 1710 11/29/19 A BROWNING    Staphylococcus aureus (BCID) NOT DETECTED NOT DETECTED Final   Methicillin resistance DETECTED (A) NOT DETECTED Final    Comment: CRITICAL RESULT CALLED TO, READ BACK BY AND VERIFIED WITHJiles Garter [facility] [facility] [place] [facility]10 11/29/19  A BROWNING    Streptococcus species NOT DETECTED NOT DETECTED Final   Streptococcus agalactiae NOT DETECTED NOT DETECTED Final   Streptococcus pneumoniae NOT DETECTED NOT DETECTED Final   Streptococcus pyogenes NOT DETECTED NOT DETECTED Final   Acinetobacter baumannii NOT DETECTED NOT DETECTED Final   Enterobacteriaceae species NOT DETECTED NOT DETECTED Final   Enterobacter cloacae complex NOT DETECTED NOT DETECTED Final   Escherichia coli NOT DETECTED NOT DETECTED Final   Klebsiella oxytoca NOT DETECTED NOT DETECTED Final   Klebsiella pneumoniae NOT DETECTED NOT DETECTED Final   Proteus species NOT DETECTED NOT DETECTED Final   Serratia marcescens NOT DETECTED NOT DETECTED Final   Haemophilus influenzae NOT DETECTED NOT DETECTED Final   Neisseria meningitidis NOT DETECTED NOT DETECTED Final   Pseudomonas aeruginosa NOT  DETECTED NOT DETECTED Final   Candida albicans NOT DETECTED NOT DETECTED Final   Candida glabrata NOT DETECTED NOT DETECTED Final   Candida krusei NOT DETECTED NOT DETECTED Final   Candida parapsilosis NOT DETECTED NOT DETECTED Final   Candida tropicalis NOT DETECTED NOT DETECTED Final    Comment: Performed at [facility], [place] [address], [place],  [address]  Respiratory Panel by RT PCR (Flu A&B, Covid) - Throat     Status: Abnormal   Collection Time: 11/28/19  9:21 PM   Specimen: Throat  Result Value Ref Range Status   SARS Coronavirus 2 by RT PCR POSITIVE (A) NEGATIVE Final    Comment: RESULT CALLED TO, READ BACK BY AND VERIFIED WITH: T [facility] RN 11/28/2331 JDW (NOTE) SARS-CoV-2 target nucleic acids are DETECTED. SARS-CoV-2 RNA is generally detectable in upper respiratory specimens  during the acute phase of infection. Positive results are indicative of the presence of the identified virus, but do not rule out bacterial infection or co-infection with other pathogens not detected by the test. Clinical correlation with patient history and other diagnostic information is necessary to determine patient infection status. The expected result is Negative. Fact Sheet for Patients:  PinkCheek.be Fact Sheet for Healthcare Providers: GravelBags.it This test is not yet approved or cleared by the [place] FDA and  has been authorized for detection and/or diagnosis of SARS-CoV-2 by FDA under an Emergency Use Authorization (EUA).  This EUA will remain in effect (meaning this test can be used) for the d uration of  the COVID-19 declaration under Section 564(b)(1) of the Act, 21 U.S.C. section 360bbb-3(b)(1), unless the authorization is terminated or revoked sooner.    Influenza A by PCR NEGATIVE NEGATIVE Final   Influenza B by PCR NEGATIVE NEGATIVE Final    Comment: (NOTE) The Xpert Xpress SARS-CoV-2/FLU/RSV assay is  intended as an aid in  the diagnosis of influenza from Nasopharyngeal swab specimens and  should not be used as a sole basis for treatment. Nasal washings and  aspirates are unacceptable for Xpert Xpress SARS-CoV-2/FLU/RSV  testing. Fact Sheet for Patients: PinkCheek.be Fact Sheet for Healthcare Providers: GravelBags.it This test is not yet approved or cleared by the [place] FDA and  has been authorized for detection and/or diagnosis of SARS-CoV-2 by  FDA under an Emergency Use Authorization (EUA). This EUA will remain  in effect (meaning this test can be used) for the duration of the  Covid-19 declaration under Section 564(b)(1) of the Act, 21  U.S.C. section 360bbb-3(b)(1), unless the authorization is  terminated or revoked. Performed at [facility], [place] [address], [place], [place] [address]   Group A Strep by PCR  Status: None   Collection Time: 11/28/19  9:22 PM   Specimen: Throat; Sterile Swab  Result Value Ref Range Status   Group A Strep by PCR NOT DETECTED NOT DETECTED Final    Comment: Performed at [facility], [address], [place], [place] [address]  Urine culture     Status: Abnormal   Collection Time: 11/28/19  9:56 PM   Specimen: Urine, Clean Catch  Result Value Ref Range Status   Specimen Description URINE, CLEAN CATCH  Final   Special Requests   Final    NONE Performed at [facility], [place] [address], [place], [place] [address]    Culture MULTIPLE SPECIES PRESENT, SUGGEST RECOLLECTION (A)  Final   Report Status 11/29/2019 FINAL  Final    FURTHER DISCHARGE INSTRUCTIONS:  Get Medicines reviewed and adjusted: Please take all your medications with you for your next visit with your Primary MD  Laboratory/radiological data: Please request your Primary MD to go over all hospital tests and procedure/radiological results at the follow up, please ask your Primary MD to get all  Hospital records sent to his/her office.  In some cases, they will be blood work, cultures and biopsy results pending at the time of your discharge. Please request that your primary care M.D. goes through all the records of your hospital data and follows up on these results.  Also Note the following: If you experience worsening of your admission symptoms, develop shortness of breath, life threatening emergency, suicidal or homicidal thoughts you must seek medical attention immediately by calling 911 or calling your MD immediately  if symptoms less severe.  You must read complete instructions/literature along with all the possible adverse reactions/side effects for all the Medicines you take and that have been prescribed to you. Take any new Medicines after you have completely understood and accpet all the possible adverse reactions/side effects.   Do not drive when taking Pain medications or sleeping medications (Benzodaizepines)  Do not take more than prescribed Pain, Sleep and Anxiety Medications. It is not advisable to combine anxiety,sleep and pain medications without talking with your primary care practitioner  Special Instructions: If you have smoked or chewed Tobacco  in the last 2 yrs please stop smoking, stop any regular Alcohol  and or any Recreational drug use.  Wear Seat belts while driving.  Please note: You were cared for by a hospitalist during your hospital stay. Once you are discharged, your primary care physician will handle any further medical issues. Please note that NO REFILLS for any discharge medications will be authorized once you are discharged, as it is imperative that you return to your primary care physician (or establish a relationship with a primary care physician if you do not have one) for your post hospital discharge needs so that they can reassess your need for medications and monitor your lab values.  Total Time spent coordinating discharge including counseling,  education and face to face time equals  45 minutes.  SignedOren Binet 12/01/2019 10:13 AM

## 2019-12-01 NOTE — Plan of Care (Signed)
  Problem: Education: Goal: Knowledge of General Education information will improve Description: Including pain rating scale, medication(s)/side effects and non-pharmacologic comfort measures Outcome: Progressing   Problem: Clinical Measurements: Goal: Will remain free from infection Outcome: Progressing   Problem: Coping: Goal: Level of anxiety will decrease Outcome: Progressing   Problem: Safety: Goal: Ability to remain free from injury will improve Outcome: Progressing   Problem: Education: Goal: Knowledge of risk factors and measures for prevention of condition will improve Outcome: Progressing   Problem: Coping: Goal: Psychosocial and spiritual needs will be supported Outcome: Progressing   Problem: Respiratory: Goal: Will maintain a patent airway Outcome: Progressing Goal: Complications related to the disease process, condition or treatment will be avoided or minimized Outcome: Progressing

## 2019-12-01 NOTE — Progress Notes (Signed)
[name] to be D/C'd Home per MD order.  Discussed with the patient and all questions fully answered.  VSS, Skin clean, dry and intact without evidence of skin break down, no evidence of skin tears noted. IV catheter discontinued intact. Site without signs and symptoms of complications. Dressing and pressure applied.  An After Visit Summary was printed and given to the patient.   D/c education completed with patient/family including follow up instructions, medication list, d/c activities limitations if indicated, with other d/c instructions as indicated by MD - patient able to verbalize understanding, all questions fully answered. Patient was also informed of the precautions that need to be taken upon her arrival home due to her positive COVID 19 diagnosis.   Patient instructed to return to ED, call 911, or call MD for any changes in condition.   Patient escorted via [place], and D/C home via private auto.  Dene Gentry 12/01/2019 12:47 PM

## 2019-12-02 ENCOUNTER — Ambulatory Visit (HOSPITAL_COMMUNITY)
Admission: RE | Admit: 2019-12-02 | Discharge: 2019-12-02 | Disposition: A | Discharge status: Home / Self Care | Payer: Medicaid Other | Source: Ambulatory Visit | Attending: Pulmonary Disease | Admitting: Pulmonary Disease

## 2019-12-02 DIAGNOSIS — U071 COVID-19: Secondary | ICD-10-CM | POA: Diagnosis not present

## 2019-12-02 DIAGNOSIS — J1282 Pneumonia due to coronavirus disease 2019: Secondary | ICD-10-CM | POA: Diagnosis not present

## 2019-12-02 MED ORDER — DIPHENHYDRAMINE HCL 50 MG/ML IJ SOLN: 50.0000 mg | Freq: Once | INTRAMUSCULAR | Status: DC | PRN

## 2019-12-02 MED ORDER — SODIUM CHLORIDE 0.9 % IV SOLN
100.0000 mg | Freq: Once | INTRAVENOUS | Status: AC
  Administered 2019-12-02: 100 mg via INTRAVENOUS
  Filled 2019-12-02: qty 20

## 2019-12-02 MED ORDER — EPINEPHRINE 0.3 MG/0.3ML IJ SOAJ: 0.3000 mg | Freq: Once | INTRAMUSCULAR | Status: DC | PRN

## 2019-12-02 MED ORDER — METHYLPREDNISOLONE SODIUM SUCC 125 MG IJ SOLR: 125.0000 mg | Freq: Once | INTRAMUSCULAR | Status: DC | PRN

## 2019-12-02 MED ORDER — SODIUM CHLORIDE 0.9 % IV SOLN: INTRAVENOUS | Status: DC | PRN

## 2019-12-02 MED ORDER — ALBUTEROL SULFATE HFA 108 (90 BASE) MCG/ACT IN AERS: 2.0000 | INHALATION_SPRAY | Freq: Once | RESPIRATORY_TRACT | Status: DC | PRN

## 2019-12-02 MED ORDER — FAMOTIDINE IN NACL 20-0.9 MG/50ML-% IV SOLN: 20.0000 mg | Freq: Once | INTRAVENOUS | Status: DC | PRN

## 2019-12-02 NOTE — Discharge Instructions (Signed)
10 Things You Can Do to Manage Your COVID-19 Symptoms at Home If you have possible or confirmed COVID-19: 1. Stay home from work and school. And stay away from other public places. If you must go out, avoid using any kind of public transportation, ridesharing, or taxis. 2. Monitor your symptoms carefully. If your symptoms get worse, call your healthcare provider immediately. 3. Get rest and stay hydrated. 4. If you have a medical appointment, call the healthcare provider ahead of time and tell them that you have or may have COVID-19. 5. For medical emergencies, call 911 and notify the dispatch personnel that you have or may have COVID-19. 6. Cover your cough and sneezes with a tissue or use the inside of your elbow. 7. Wash your hands often with soap and water for at least 20 seconds or clean your hands with an alcohol-based hand sanitizer that contains at least 60% alcohol. 8. As much as possible, stay in a specific room and away from other people in your home. Also, you should use a separate bathroom, if available. If you need to be around other people in or outside of the home, wear a mask. 9. Avoid sharing personal items with other people in your household, like dishes, towels, and bedding. 10. Clean all surfaces that are touched often, like counters, tabletops, and doorknobs. Use household cleaning sprays or wipes according to the label instructions. cdc.gov/coronavirus 02/02/2019 This information is not intended to replace advice given to you by your health care provider. Make sure you discuss any questions you have with your health care provider. Document Revised: 07/07/2019 Document Reviewed: 07/07/2019 Elsevier Patient Education  2020 Elsevier Inc.  

## 2019-12-02 NOTE — Progress Notes (Signed)
Patient ID: Tracy Vaughn, female   DOB: Dec 21, 1975, 44 y.o.   MRN: [id number]  Diagnosis: [id number] wright Physician:  Procedure: [facility] Med: remdesivir infusion - Provided patient with remdesivir fact sheet for patients, parents and caregivers prior to infusion.  Complications: No immediate complications noted.  Discharge: Discharged home   Heide Scales 12/02/2019

## 2019-12-03 ENCOUNTER — Ambulatory Visit (HOSPITAL_COMMUNITY)
Admission: RE | Admit: 2019-12-03 | Discharge: 2019-12-03 | Disposition: A | Discharge status: Home / Self Care | Payer: Medicaid Other | Source: Ambulatory Visit | Attending: Pulmonary Disease | Admitting: Pulmonary Disease

## 2019-12-03 DIAGNOSIS — J1282 Pneumonia due to coronavirus disease 2019: Secondary | ICD-10-CM | POA: Insufficient documentation

## 2019-12-03 DIAGNOSIS — U071 COVID-19: Secondary | ICD-10-CM | POA: Insufficient documentation

## 2019-12-03 MED ORDER — DIPHENHYDRAMINE HCL 50 MG/ML IJ SOLN: 50.0000 mg | Freq: Once | INTRAMUSCULAR | Status: DC | PRN

## 2019-12-03 MED ORDER — METHYLPREDNISOLONE SODIUM SUCC 125 MG IJ SOLR: 125.0000 mg | Freq: Once | INTRAMUSCULAR | Status: DC | PRN

## 2019-12-03 MED ORDER — SODIUM CHLORIDE 0.9 % IV SOLN
100.0000 mg | Freq: Once | INTRAVENOUS | Status: AC
  Administered 2019-12-03: 100 mg via INTRAVENOUS
  Filled 2019-12-03: qty 20

## 2019-12-03 MED ORDER — SODIUM CHLORIDE 0.9 % IV SOLN: INTRAVENOUS | Status: DC | PRN

## 2019-12-03 MED ORDER — FAMOTIDINE IN NACL 20-0.9 MG/50ML-% IV SOLN: 20.0000 mg | Freq: Once | INTRAVENOUS | Status: DC | PRN

## 2019-12-03 MED ORDER — EPINEPHRINE 0.3 MG/0.3ML IJ SOAJ: 0.3000 mg | Freq: Once | INTRAMUSCULAR | Status: DC | PRN

## 2019-12-03 MED ORDER — ALBUTEROL SULFATE HFA 108 (90 BASE) MCG/ACT IN AERS: 2.0000 | INHALATION_SPRAY | Freq: Once | RESPIRATORY_TRACT | Status: DC | PRN

## 2019-12-03 NOTE — Discharge Instructions (Signed)
COVID-19 COVID-19 is a respiratory infection that is caused by a virus called severe acute respiratory syndrome coronavirus 2 (SARS-CoV-2). The disease is also known as coronavirus disease or novel coronavirus. In some people, the virus may not cause any symptoms. In others, it may cause a serious infection. The infection can get worse quickly and can lead to complications, such as:  Pneumonia, or infection of the lungs.  Acute respiratory distress syndrome or ARDS. This is a condition in which fluid build-up in the lungs prevents the lungs from filling with air and passing oxygen into the blood.  Acute respiratory failure. This is a condition in which there is not enough oxygen passing from the lungs to the body or when carbon dioxide is not passing from the lungs out of the body.  Sepsis or septic shock. This is a serious bodily reaction to an infection.  Blood clotting problems.  Secondary infections due to bacteria or fungus.  Organ failure. This is when your body's organs stop working. The virus that causes COVID-19 is contagious. This means that it can spread from person to person through droplets from coughs and sneezes (respiratory secretions). What are the causes? This illness is caused by a virus. You may catch the virus by:  Breathing in droplets from an infected person. Droplets can be spread by a person breathing, speaking, singing, coughing, or sneezing.  Touching something, like a table or a doorknob, that was exposed to the virus (contaminated) and then touching your mouth, nose, or eyes. What increases the risk? Risk for infection You are more likely to be infected with this virus if you:  Are within 6 feet (2 meters) of a person with COVID-19.  Provide care for or live with a person who is infected with COVID-19.  Spend time in crowded indoor spaces or live in shared housing. Risk for serious illness You are more likely to become seriously ill from the virus if you:   Are 50 years of age or older. The higher your age, the more you are at risk for serious illness.  Live in a nursing home or long-term care facility.  Have cancer.  Have a long-term (chronic) disease such as: ? Chronic lung disease, including chronic obstructive pulmonary disease or asthma. ? A long-term disease that lowers your body's ability to fight infection (immunocompromised). ? Heart disease, including heart failure, a condition in which the arteries that lead to the heart become narrow or blocked (coronary artery disease), a disease which makes the heart muscle thick, weak, or stiff (cardiomyopathy). ? Diabetes. ? Chronic kidney disease. ? Sickle cell disease, a condition in which red blood cells have an abnormal "sickle" shape. ? Liver disease.  Are obese. What are the signs or symptoms? Symptoms of this condition can range from mild to severe. Symptoms may appear any time from 2 to 14 days after being exposed to the virus. They include:  A fever or chills.  A cough.  Difficulty breathing.  Headaches, body aches, or muscle aches.  Runny or stuffy (congested) nose.  A sore throat.  New loss of taste or smell. Some people may also have stomach problems, such as nausea, vomiting, or diarrhea. Other people may not have any symptoms of COVID-19. How is this diagnosed? This condition may be diagnosed based on:  Your signs and symptoms, especially if: ? You live in an area with a COVID-19 outbreak. ? You recently traveled to or from an area where the virus is common. ? You   provide care for or live with a person who was diagnosed with COVID-19. ? You were exposed to a person who was diagnosed with COVID-19.  A physical exam.  Lab tests, which may include: ? Taking a sample of fluid from the back of your nose and throat (nasopharyngeal fluid), your nose, or your throat using a swab. ? A sample of mucus from your lungs (sputum). ? Blood tests.  Imaging tests, which  may include, X-rays, CT scan, or ultrasound. How is this treated? At present, there is no medicine to treat COVID-19. Medicines that treat other diseases are being used on a trial basis to see if they are effective against COVID-19. Your health care provider will talk with you about ways to treat your symptoms. For most people, the infection is mild and can be managed at home with rest, fluids, and over-the-counter medicines. Treatment for a serious infection usually takes places in a hospital intensive care unit (ICU). It may include one or more of the following treatments. These treatments are given until your symptoms improve.  Receiving fluids and medicines through an IV.  Supplemental oxygen. Extra oxygen is given through a tube in the nose, a face mask, or a hood.  Positioning you to lie on your stomach (prone position). This makes it easier for oxygen to get into the lungs.  Continuous positive airway pressure (CPAP) or bi-level positive airway pressure (BPAP) machine. This treatment uses mild air pressure to keep the airways open. A tube that is connected to a motor delivers oxygen to the body.  Ventilator. This treatment moves air into and out of the lungs by using a tube that is placed in your windpipe.  Tracheostomy. This is a procedure to create a hole in the neck so that a breathing tube can be inserted.  Extracorporeal membrane oxygenation (ECMO). This procedure gives the lungs a chance to recover by taking over the functions of the heart and lungs. It supplies oxygen to the body and removes carbon dioxide. Follow these instructions at home: Lifestyle  If you are sick, stay home except to get medical care. Your health care provider will tell you how long to stay home. Call your health care provider before you go for medical care.  Rest at home as told by your health care provider.  Do not use any products that contain nicotine or tobacco, such as cigarettes, e-cigarettes, and  chewing tobacco. If you need help quitting, ask your health care provider.  Return to your normal activities as told by your health care provider. Ask your health care provider what activities are safe for you. General instructions  Take over-the-counter and prescription medicines only as told by your health care provider.  Drink enough fluid to keep your urine pale yellow.  Keep all follow-up visits as told by your health care provider. This is important. How is this prevented?  There is no vaccine to help prevent COVID-19 infection. However, there are steps you can take to protect yourself and others from this virus. To protect yourself:   Do not travel to areas where COVID-19 is a risk. The areas where COVID-19 is reported change often. To identify high-risk areas and travel restrictions, check the CDC travel website: wwwnc.cdc.gov/travel/notices  If you live in, or must travel to, an area where COVID-19 is a risk, take precautions to avoid infection. ? Stay away from people who are sick. ? Wash your hands often with soap and water for 20 seconds. If soap and water   are not available, use an alcohol-based hand sanitizer. ? Avoid touching your mouth, face, eyes, or nose. ? Avoid going out in public, follow guidance from your state and local health authorities. ? If you must go out in public, wear a cloth face covering or face mask. Make sure your mask covers your nose and mouth. ? Avoid crowded indoor spaces. Stay at least 6 feet (2 meters) away from others. ? Disinfect objects and surfaces that are frequently touched every day. This may include:  Counters and tables.  Doorknobs and light switches.  Sinks and faucets.  Electronics, such as phones, remote controls, keyboards, computers, and tablets. To protect others: If you have symptoms of COVID-19, take steps to prevent the virus from spreading to others.  If you think you have a COVID-19 infection, contact your health care  provider right away. Tell your health care team that you think you may have a COVID-19 infection.  Stay home. Leave your house only to seek medical care. Do not use public transport.  Do not travel while you are sick.  Wash your hands often with soap and water for 20 seconds. If soap and water are not available, use alcohol-based hand sanitizer.  Stay away from other members of your household. Let healthy household members care for children and pets, if possible. If you have to care for children or pets, wash your hands often and wear a mask. If possible, stay in your own room, separate from others. Use a different bathroom.  Make sure that all people in your household wash their hands well and often.  Cough or sneeze into a tissue or your sleeve or elbow. Do not cough or sneeze into your hand or into the air.  Wear a cloth face covering or face mask. Make sure your mask covers your nose and mouth. Where to find more information  Centers for Disease Control and Prevention: www.cdc.gov/coronavirus/2019-ncov/index.html  World Health Organization: www.who.int/health-topics/coronavirus Contact a health care provider if:  You live in or have traveled to an area where COVID-19 is a risk and you have symptoms of the infection.  You have had contact with someone who has COVID-19 and you have symptoms of the infection. Get help right away if:  You have trouble breathing.  You have pain or pressure in your chest.  You have confusion.  You have bluish lips and fingernails.  You have difficulty waking from sleep.  You have symptoms that get worse. These symptoms may represent a serious problem that is an emergency. Do not wait to see if the symptoms will go away. Get medical help right away. Call your local emergency services (911 in the [place] Do not drive yourself to the hospital. Let the emergency medical personnel know if you think you have COVID-19. Summary  COVID-19 is a  respiratory infection that is caused by a virus. It is also known as coronavirus disease or novel coronavirus. It can cause serious infections, such as pneumonia, acute respiratory distress syndrome, acute respiratory failure, or sepsis.  The virus that causes COVID-19 is contagious. This means that it can spread from person to person through droplets from breathing, speaking, singing, coughing, or sneezing.  You are more likely to develop a serious illness if you are 50 years of age or older, have a weak immune system, live in a nursing home, or have chronic disease.  There is no medicine to treat COVID-19. Your health care provider will talk with you about ways to treat your symptoms.    Take steps to protect yourself and others from infection. Wash your hands often and disinfect objects and surfaces that are frequently touched every day. Stay away from people who are sick and wear a mask if you are sick. This information is not intended to replace advice given to you by your health care provider. Make sure you discuss any questions you have with your health care provider. Document Revised: 05/20/2019 Document Reviewed: 08/26/2018 Elsevier Patient Education  2020 Elsevier Inc.  

## 2019-12-03 NOTE — Progress Notes (Signed)
  Diagnosis: COVID-19  Physician:dr wright  Procedure: [facility] Med: remdesivir infusion - Provided patient with remdesivir fact sheet for patients, parents and caregivers prior to infusion.  Complications: No immediate complications noted.  Discharge: Discharged home   [place] 12/03/2019

## 2019-12-05 ENCOUNTER — Ambulatory Visit: Payer: Medicaid Other | Attending: Internal Medicine

## 2019-12-05 DIAGNOSIS — Z20822 Contact with and (suspected) exposure to covid-19: Secondary | ICD-10-CM

## 2019-12-06 ENCOUNTER — Telehealth: Payer: Self-pay | Admitting: *Deleted

## 2019-12-06 NOTE — Telephone Encounter (Signed)
Patient has had exposure to COVID in the home-  notified while calling result to another family member- result pending.

## 2019-12-07 LAB — NOVEL CORONAVIRUS, NAA: SARS-CoV-2, NAA: NOT DETECTED

## 2019-12-07 LAB — SARS-COV-2, NAA 2 DAY TAT

## 2019-12-07 LAB — SPECIMEN STATUS REPORT

## 2020-10-17 ENCOUNTER — Inpatient Hospital Stay (HOSPITAL_BASED_OUTPATIENT_CLINIC_OR_DEPARTMENT_OTHER)
Admission: EM | Admit: 2020-10-17 | Discharge: 2020-10-20 | DRG: 638 | Disposition: A | Discharge status: Home / Self Care | Payer: Medicaid Other | Source: Ambulatory Visit | Attending: Internal Medicine | Admitting: Internal Medicine

## 2020-10-17 ENCOUNTER — Other Ambulatory Visit: Payer: Self-pay

## 2020-10-17 ENCOUNTER — Encounter (HOSPITAL_BASED_OUTPATIENT_CLINIC_OR_DEPARTMENT_OTHER): Payer: Self-pay | Admitting: Emergency Medicine

## 2020-10-17 ENCOUNTER — Emergency Department (HOSPITAL_BASED_OUTPATIENT_CLINIC_OR_DEPARTMENT_OTHER): Payer: Medicaid Other

## 2020-10-17 DIAGNOSIS — A09 Infectious gastroenteritis and colitis, unspecified: Secondary | ICD-10-CM | POA: Diagnosis not present

## 2020-10-17 DIAGNOSIS — K529 Noninfective gastroenteritis and colitis, unspecified: Secondary | ICD-10-CM | POA: Diagnosis present

## 2020-10-17 DIAGNOSIS — F259 Schizoaffective disorder, unspecified: Secondary | ICD-10-CM | POA: Diagnosis present

## 2020-10-17 DIAGNOSIS — R918 Other nonspecific abnormal finding of lung field: Secondary | ICD-10-CM | POA: Diagnosis present

## 2020-10-17 DIAGNOSIS — I10 Essential (primary) hypertension: Secondary | ICD-10-CM | POA: Diagnosis present

## 2020-10-17 DIAGNOSIS — E871 Hypo-osmolality and hyponatremia: Secondary | ICD-10-CM | POA: Diagnosis present

## 2020-10-17 DIAGNOSIS — R197 Diarrhea, unspecified: Secondary | ICD-10-CM | POA: Diagnosis not present

## 2020-10-17 DIAGNOSIS — Z7984 Long term (current) use of oral hypoglycemic drugs: Secondary | ICD-10-CM

## 2020-10-17 DIAGNOSIS — Z20822 Contact with and (suspected) exposure to covid-19: Secondary | ICD-10-CM | POA: Diagnosis present

## 2020-10-17 DIAGNOSIS — D352 Benign neoplasm of pituitary gland: Secondary | ICD-10-CM | POA: Diagnosis present

## 2020-10-17 DIAGNOSIS — N179 Acute kidney failure, unspecified: Secondary | ICD-10-CM | POA: Diagnosis present

## 2020-10-17 DIAGNOSIS — R112 Nausea with vomiting, unspecified: Secondary | ICD-10-CM | POA: Diagnosis not present

## 2020-10-17 DIAGNOSIS — F250 Schizoaffective disorder, bipolar type: Secondary | ICD-10-CM | POA: Diagnosis not present

## 2020-10-17 DIAGNOSIS — D649 Anemia, unspecified: Secondary | ICD-10-CM | POA: Diagnosis present

## 2020-10-17 DIAGNOSIS — E162 Hypoglycemia, unspecified: Secondary | ICD-10-CM

## 2020-10-17 DIAGNOSIS — Z23 Encounter for immunization: Secondary | ICD-10-CM

## 2020-10-17 DIAGNOSIS — E11649 Type 2 diabetes mellitus with hypoglycemia without coma: Secondary | ICD-10-CM | POA: Diagnosis present

## 2020-10-17 DIAGNOSIS — Z79899 Other long term (current) drug therapy: Secondary | ICD-10-CM | POA: Diagnosis not present

## 2020-10-17 DIAGNOSIS — E860 Dehydration: Secondary | ICD-10-CM | POA: Diagnosis present

## 2020-10-17 DIAGNOSIS — E876 Hypokalemia: Secondary | ICD-10-CM | POA: Diagnosis present

## 2020-10-17 LAB — URINALYSIS, MICROSCOPIC (REFLEX)

## 2020-10-17 LAB — LIPASE, BLOOD: Lipase: 35 U/L (ref 11–51)

## 2020-10-17 LAB — COMPREHENSIVE METABOLIC PANEL
ALT: 17 U/L (ref 0–44)
AST: 27 U/L (ref 15–41)
Albumin: 3.4 g/dL — ABNORMAL LOW (ref 3.5–5.0)
Alkaline Phosphatase: 61 U/L (ref 38–126)
Anion gap: 12 (ref 5–15)
BUN: 20 mg/dL (ref 6–20)
CO2: 21 mmol/L — ABNORMAL LOW (ref 22–32)
Calcium: 8.3 mg/dL — ABNORMAL LOW (ref 8.9–10.3)
Chloride: 95 mmol/L — ABNORMAL LOW (ref 98–111)
Creatinine, Ser: 1.81 mg/dL — ABNORMAL HIGH (ref 0.44–1.00)
GFR, Estimated: 35 mL/min — ABNORMAL LOW (ref >60)
Glucose, Bld: 46 mg/dL — ABNORMAL LOW (ref 70–99)
Potassium: 2.6 mmol/L — CL (ref 3.5–5.1)
Sodium: 128 mmol/L — ABNORMAL LOW (ref 135–145)
Total Bilirubin: 0.2 mg/dL — ABNORMAL LOW (ref 0.3–1.2)
Total Protein: 8.8 g/dL — ABNORMAL HIGH (ref 6.5–8.1)

## 2020-10-17 LAB — URINALYSIS, ROUTINE W REFLEX MICROSCOPIC
Bilirubin Urine: NEGATIVE
Glucose, UA: NEGATIVE mg/dL
Hgb urine dipstick: NEGATIVE
Ketones, ur: NEGATIVE mg/dL
Nitrite: NEGATIVE
Protein, ur: NEGATIVE mg/dL
Specific Gravity, Urine: 1.01 (ref 1.005–1.030)
pH: 6 (ref 5.0–8.0)

## 2020-10-17 LAB — CBG MONITORING, ED
Glucose-Capillary: 116 mg/dL — ABNORMAL HIGH (ref 70–99)
Glucose-Capillary: 46 mg/dL — ABNORMAL LOW (ref 70–99)
Glucose-Capillary: 77 mg/dL (ref 70–99)
Glucose-Capillary: 66 mg/dL — ABNORMAL LOW (ref 70–99)
Glucose-Capillary: 88 mg/dL (ref 70–99)
Glucose-Capillary: 64 mg/dL — ABNORMAL LOW (ref 70–99)
Glucose-Capillary: 70 mg/dL (ref 70–99)
Glucose-Capillary: 57 mg/dL — ABNORMAL LOW (ref 70–99)
Glucose-Capillary: 119 mg/dL — ABNORMAL HIGH (ref 70–99)
Glucose-Capillary: 94 mg/dL (ref 70–99)

## 2020-10-17 LAB — CBC WITH DIFFERENTIAL/PLATELET
Abs Immature Granulocytes: 0.07 10*3/uL (ref 0.00–0.07)
Basophils Absolute: 0 10*3/uL (ref 0.0–0.1)
Basophils Relative: 0 %
Eosinophils Absolute: 0.1 10*3/uL (ref 0.0–0.5)
Eosinophils Relative: 1 %
HCT: 37 % (ref 36.0–46.0)
Hemoglobin: 11.5 g/dL — ABNORMAL LOW (ref 12.0–15.0)
Immature Granulocytes: 0 %
Lymphocytes Relative: 20 %
Lymphs Abs: 3.5 10*3/uL (ref 0.7–4.0)
MCH: 24.4 pg — ABNORMAL LOW (ref 26.0–34.0)
MCHC: 31.1 g/dL (ref 30.0–36.0)
MCV: 78.4 fL — ABNORMAL LOW (ref 80.0–100.0)
Monocytes Absolute: 1.1 10*3/uL — ABNORMAL HIGH (ref 0.1–1.0)
Monocytes Relative: 6 %
Neutro Abs: 12.7 10*3/uL — ABNORMAL HIGH (ref 1.7–7.7)
Neutrophils Relative %: 73 %
Platelets: 491 10*3/uL — ABNORMAL HIGH (ref 150–400)
RBC: 4.72 MIL/uL (ref 3.87–5.11)
RDW: 15.9 % — ABNORMAL HIGH (ref 11.5–15.5)
WBC: 17.5 10*3/uL — ABNORMAL HIGH (ref 4.0–10.5)
nRBC: 0 % (ref 0.0–0.2)

## 2020-10-17 LAB — RESP PANEL BY RT-PCR (FLU A&B, COVID) ARPGX2
Influenza A by PCR: NEGATIVE
Influenza B by PCR: NEGATIVE
SARS Coronavirus 2 by RT PCR: NEGATIVE

## 2020-10-17 LAB — MAGNESIUM: Magnesium: 1.5 mg/dL — ABNORMAL LOW (ref 1.7–2.4)

## 2020-10-17 LAB — PREGNANCY, URINE: Preg Test, Ur: NEGATIVE

## 2020-10-17 LAB — GLUCOSE, CAPILLARY: Glucose-Capillary: 225 mg/dL — ABNORMAL HIGH (ref 70–99)

## 2020-10-17 MED ORDER — VENLAFAXINE HCL ER 75 MG PO CP24
75.0000 mg | ORAL_CAPSULE | Freq: Every day | ORAL | Status: DC
  Administered 2020-10-18 – 2020-10-20 (×3): 75 mg via ORAL
  Filled 2020-10-17 (×3): qty 1

## 2020-10-17 MED ORDER — LACTATED RINGERS IV BOLUS
1000.0000 mL | Freq: Once | INTRAVENOUS | Status: AC
  Administered 2020-10-17: 1000 mL via INTRAVENOUS

## 2020-10-17 MED ORDER — DEXTROSE 50 % IV SOLN: 1.0000 | Freq: Once | INTRAVENOUS | Status: AC

## 2020-10-17 MED ORDER — ROSUVASTATIN CALCIUM 20 MG PO TABS
40.0000 mg | ORAL_TABLET | Freq: Every day | ORAL | Status: DC
  Administered 2020-10-19: 40 mg via ORAL
  Filled 2020-10-17: qty 2

## 2020-10-17 MED ORDER — DEXTROSE 5 % IV SOLN: Freq: Once | INTRAVENOUS | Status: AC

## 2020-10-17 MED ORDER — OCTREOTIDE ACETATE 50 MCG/ML IJ SOLN
50.0000 ug | Freq: Once | INTRAMUSCULAR | Status: AC
  Administered 2020-10-17: 50 ug via SUBCUTANEOUS
  Filled 2020-10-17: qty 1

## 2020-10-17 MED ORDER — POTASSIUM CHLORIDE 10 MEQ/100ML IV SOLN
10.0000 meq | INTRAVENOUS | Status: AC
  Administered 2020-10-17 (×2): 10 meq via INTRAVENOUS
  Filled 2020-10-17 (×2): qty 100

## 2020-10-17 MED ORDER — MAGNESIUM SULFATE 2 GM/50ML IV SOLN
2.0000 g | Freq: Once | INTRAVENOUS | Status: AC
  Administered 2020-10-17: 2 g via INTRAVENOUS
  Filled 2020-10-17: qty 50

## 2020-10-17 MED ORDER — LITHIUM CARBONATE 300 MG PO CAPS: 300.0000 mg | ORAL_CAPSULE | ORAL | Status: DC

## 2020-10-17 MED ORDER — IOHEXOL 300 MG/ML  SOLN
100.0000 mL | Freq: Once | INTRAMUSCULAR | Status: AC | PRN
  Administered 2020-10-17: 80 mL via INTRAVENOUS

## 2020-10-17 MED ORDER — CHLORHEXIDINE GLUCONATE CLOTH 2 % EX PADS
6.0000 | MEDICATED_PAD | Freq: Every day | CUTANEOUS | Status: DC
  Administered 2020-10-17 – 2020-10-20 (×4): 6 via TOPICAL

## 2020-10-17 MED ORDER — POTASSIUM CHLORIDE CRYS ER 20 MEQ PO TBCR
40.0000 meq | EXTENDED_RELEASE_TABLET | Freq: Once | ORAL | Status: AC
  Administered 2020-10-17: 40 meq via ORAL
  Filled 2020-10-17: qty 2

## 2020-10-17 MED ORDER — SODIUM CHLORIDE 0.9 % IV SOLN: INTRAVENOUS | Status: AC

## 2020-10-17 MED ORDER — PANTOPRAZOLE SODIUM 40 MG PO TBEC
40.0000 mg | DELAYED_RELEASE_TABLET | Freq: Every day | ORAL | Status: DC
  Administered 2020-10-18 – 2020-10-20 (×3): 40 mg via ORAL
  Filled 2020-10-17 (×3): qty 1

## 2020-10-17 MED ORDER — DEXTROSE 50 % IV SOLN
INTRAVENOUS | Status: AC
  Administered 2020-10-17: 50 mL via INTRAVENOUS
  Filled 2020-10-17: qty 50

## 2020-10-17 MED ORDER — ORAL CARE MOUTH RINSE: 15.0000 mL | Freq: Two times a day (BID) | OROMUCOSAL | Status: DC

## 2020-10-17 MED ORDER — ACETAMINOPHEN 325 MG PO TABS: 650.0000 mg | ORAL_TABLET | Freq: Four times a day (QID) | ORAL | Status: DC | PRN

## 2020-10-17 MED ORDER — ACETAMINOPHEN 650 MG RE SUPP: 650.0000 mg | Freq: Four times a day (QID) | RECTAL | Status: DC | PRN

## 2020-10-17 MED ORDER — DEXTROSE 50 % IV SOLN
1.0000 | Freq: Once | INTRAVENOUS | Status: AC
  Administered 2020-10-17: 50 mL via INTRAVENOUS
  Filled 2020-10-17: qty 50

## 2020-10-17 MED ORDER — CARIPRAZINE HCL 1.5 MG PO CAPS
1.5000 mg | ORAL_CAPSULE | Freq: Every day | ORAL | Status: DC
  Administered 2020-10-18 – 2020-10-20 (×3): 1.5 mg via ORAL
  Filled 2020-10-17 (×3): qty 1

## 2020-10-17 MED ORDER — BENZTROPINE MESYLATE 0.5 MG PO TABS
2.0000 mg | ORAL_TABLET | Freq: Every day | ORAL | Status: DC
  Administered 2020-10-18 – 2020-10-19 (×3): 2 mg via ORAL
  Filled 2020-10-17 (×3): qty 4

## 2020-10-17 MED ORDER — CHLORHEXIDINE GLUCONATE 0.12 % MT SOLN: 15.0000 mL | Freq: Two times a day (BID) | OROMUCOSAL | Status: DC

## 2020-10-17 MED ORDER — DEXTROSE 10 % IV SOLN: INTRAVENOUS | Status: DC

## 2020-10-17 MED ORDER — ENOXAPARIN SODIUM 60 MG/0.6ML ~~LOC~~ SOLN
60.0000 mg | SUBCUTANEOUS | Status: DC
  Administered 2020-10-18: 60 mg via SUBCUTANEOUS
  Filled 2020-10-17 (×2): qty 0.6

## 2020-10-17 NOTE — ED Triage Notes (Signed)
Per EMS:  Pt having diarrhea x 1 week.  Pt went to MD office today, had low blood sugar and low blood pressure and was told to go to the ED.  Pt went home, called 911.  Pt blood sugar was initially 48, pt ate one pop tart, rechecked CBG 42.  Pt started on D 10, given 125 cc.  Repeat sugar 94.  Pt CAO at all times.     Pt states she started having diarrhea since she started on glipizide 10/09/20.  Pt also taking glimepiride and metformen.  Pt hasn't had an appetite lately so she hasn't been eating very much.

## 2020-10-17 NOTE — ED Notes (Signed)
BG 94, Glucometer not syncing with EPIC

## 2020-10-17 NOTE — Progress Notes (Signed)
Paged Dr. Hal Hope to inform that patient has arrived from [place] ED.

## 2020-10-17 NOTE — ED Notes (Signed)
Pt up to bedside commode to obtain stool sample

## 2020-10-17 NOTE — ED Notes (Signed)
Pt noted to be looking up towards ceiling. States "her eyes keep going up and she cant move them down.

## 2020-10-17 NOTE — H&P (Signed)
History and Physical    Tracy Vaughn YIR:[id number] DOB: 05-Dec-1975 DOA: 10/17/2020  PCP: Benito Mccreedy, MD   Patient coming from: Home.  Chief Complaint: Diarrhea and low blood sugar.  HPI: Tracy Vaughn is a 45 y.o. female with history of diabetes mellitus type 2, hypertension, schizoaffective disorder has been experiencing diarrhea for the last week and a half with nausea vomiting.  Patient states she was started on a new diabetic medication either glyburide or glipizide name of which is not clear about 2 weeks ago and few days later he started developing vomiting and diarrhea but no abdominal pain fever chills.  Denies any blood in the diarrhea.  Denies any recent sick contacts or any use of antibiotics or recent travel.  No abnormal food.  Patient had gone to her PCP and was referred to the ER because of low blood sugar and hypotension.  ED Course: In the ER patient was afebrile with CT scan of the abdomen pelvis showing features concerning for diarrheal state.  Covid test was negative.  Labs are significant for acute renal failure with creatinine 1.8 increased from 1.9 in April 2021.  Sodium was 128 potassium 2.6 magnesium 1.5 patient's blood sugar remained consistently low despite D50 starting on D5 and giving octreotide and had to be started on D10.  Patient had leukocytosis chest x-ray shows possibility of pneumonia but patient does not have any productive cough shortness of breath or chest pain.  Patient was started on fluids and admitted for hyperglycemia renal failure dehydration.  Review of Systems: As per HPI, rest all negative.   Past Medical History:  Diagnosis Date  . Diabetes mellitus without complication ([place])   . Mental impairment   . Schizoaffective disorder ([place])     History reviewed. No pertinent surgical history.   reports that she has never smoked. She has never used smokeless tobacco. She reports that she does not drink alcohol. No history on file for drug  use.  Allergies  Allergen Reactions  . Fanapt [Iloperidone]     Unknown, but takes the cogentin to fix the side effects  . Procaine Rash    bumps Other reaction(s): hives, itching    History reviewed. No pertinent family history.  Prior to Admission medications   Medication Sig Start Date End Date Taking? Authorizing Provider  benztropine (COGENTIN) 2 MG tablet Take 1 tablet (2 mg total) by mouth at bedtime. 10/14/18  Yes Daleen Bo, MD  cariprazine [facility]) capsule Take 1.5 mg by mouth daily.   Yes [provider]  glimepiride (AMARYL) 4 MG tablet Take 4 mg by mouth 2 (two) times daily. 08/27/20  Yes [provider]  glipiZIDE (GLUCOTROL) 10 MG tablet Take 10 mg by mouth daily. 10/09/20  Yes [provider]  lisinopril-hydrochlorothiazide (ZESTORETIC) 20-25 MG tablet Take 1 tablet by mouth daily. 10/17/19  Yes [provider]  lithium carbonate 300 MG capsule Take 300-600 mg by mouth See admin instructions. Take 1 capsule in the morning and 2 capsule at bedtime.   Yes [provider]  metFORMIN (GLUCOPHAGE) 1000 MG tablet Take 1,000 mg by mouth 2 (two) times daily. 10/17/19  Yes [provider]  omeprazole (PRILOSEC) 20 MG capsule Take 20 mg by mouth daily.   Yes [provider]  propranolol (INDERAL) 20 MG tablet Take 20 mg by mouth 2 (two) times daily.   Yes [provider]  rosuvastatin (CRESTOR) 40 MG tablet Take 40 mg by mouth at bedtime. 08/31/20  Yes  [provider]  venlafaxine XR (EFFEXOR-XR) 75 MG 24 hr capsule Take 75 mg by mouth daily.   Yes [provider]    Physical Exam: Constitutional: Moderately built and nourished. Vitals:   10/17/20 2000 10/17/20 2034 10/17/20 2200 10/17/20 2329  BP: 123/73 94/66 115/65   Pulse: 91 83 79   Resp: (!) 25 (!) 21 (!) 22   Temp:      SpO2: 100% 100% 99%   Weight:    123.8 kg  Height:       Eyes: Anicteric no pallor. ENMT: No discharge from  the ears eyes nose or mouth. Neck: No mass felt.  No neck rigidity. Respiratory: No rhonchi or crepitations. Cardiovascular: S1-S2 heard. Abdomen: Soft nontender bowel sounds present. Musculoskeletal: No edema. Skin: Chronic changes of the face. Neurologic: Alert awake oriented to time place and person.  Moves all extremities. Psychiatric: Appears normal.  Normal affect.   Labs on Admission: I have personally reviewed following labs and imaging studies  CBC: Recent Labs  Lab 10/17/20 1700  WBC 17.5*  NEUTROABS 12.7*  HGB 11.5*  HCT 37.0  MCV 78.4*  PLT 322*   Basic Metabolic Panel: Recent Labs  Lab 10/17/20 1700 10/17/20 1730  NA 128*  --   K 2.6*  --   CL 95*  --   CO2 21*  --   GLUCOSE 46*  --   BUN 20  --   CREATININE 1.81*  --   CALCIUM 8.3*  --   MG  --  1.5*   GFR: Estimated Creatinine Clearance: 53.3 mL/min (A) (by C-G formula based on SCr of 1.81 mg/dL (H)). Liver Function Tests: Recent Labs  Lab 10/17/20 1700  AST 27  ALT 17  ALKPHOS 61  BILITOT 0.2*  PROT 8.8*  ALBUMIN 3.4*   Recent Labs  Lab 10/17/20 1700  LIPASE 35   No results for input(s): AMMONIA in the last 168 hours. Coagulation Profile: No results for input(s): INR, PROTIME in the last 168 hours. Cardiac Enzymes: No results for input(s): CKTOTAL, CKMB, CKMBINDEX, TROPONINI in the last 168 hours. BNP (last 3 results) No results for input(s): PROBNP in the last 8760 hours. HbA1C: No results for input(s): HGBA1C in the last 72 hours. CBG: Recent Labs  Lab 10/17/20 1906 10/17/20 1940 10/17/20 2032 10/17/20 2102 10/17/20 2335  GLUCAP 70 57* 94 119* 225*   Lipid Profile: No results for input(s): CHOL, HDL, LDLCALC, TRIG, CHOLHDL, LDLDIRECT in the last 72 hours. Thyroid Function Tests: No results for input(s): TSH, T4TOTAL, FREET4, T3FREE, THYROIDAB in the last 72 hours. Anemia Panel: No results for input(s): VITAMINB12, FOLATE, FERRITIN, TIBC, IRON, RETICCTPCT in the last 72  hours. Urine analysis:    Component Value Date/Time   COLORURINE YELLOW 10/17/2020 1915   APPEARANCEUR CLEAR 10/17/2020 1915   LABSPEC 1.010 10/17/2020 1915   PHURINE 6.0 10/17/2020 1915   GLUCOSEU NEGATIVE 10/17/2020 1915   HGBUR NEGATIVE 10/17/2020 [place] 10/17/2020 [place] 10/17/2020 1915   PROTEINUR NEGATIVE 10/17/2020 1915   UROBILINOGEN 1.0 11/06/2012 2014   NITRITE NEGATIVE 10/17/2020 1915   LEUKOCYTESUR SMALL (A) 10/17/2020 1915   Sepsis Labs: @LABRCNTIP (procalcitonin:4,lacticidven:4) ) Recent Results (from the past 240 hour(s))  Resp Panel by RT-PCR (Flu A&B, Covid) Nasopharyngeal Swab     Status: None   Collection Time: 10/17/20  5:22 PM   Specimen: Nasopharyngeal Swab; Nasopharyngeal(NP) swabs in vial transport medium  Result Value Ref Range Status  SARS Coronavirus 2 by RT PCR NEGATIVE NEGATIVE Final    Comment: (NOTE) SARS-CoV-2 target nucleic acids are NOT DETECTED.  The SARS-CoV-2 RNA is generally detectable in upper respiratory specimens during the acute phase of infection. The lowest concentration of SARS-CoV-2 viral copies this assay can detect is 138 copies/mL. A negative result does not preclude SARS-Cov-2 infection and should not be used as the sole basis for treatment or other patient management decisions. A negative result may occur with  improper specimen collection/handling, submission of specimen other than nasopharyngeal swab, presence of viral mutation(s) within the areas targeted by this assay, and inadequate number of viral copies(<138 copies/mL). A negative result must be combined with clinical observations, patient history, and epidemiological information. The expected result is Negative.  Fact Sheet for Patients:  EntrepreneurPulse.com.au  Fact Sheet for Healthcare Providers:  IncredibleEmployment.be  This test is no t yet approved or cleared by the [place]  FDA and  has been authorized for detection and/or diagnosis of SARS-CoV-2 by FDA under an Emergency Use Authorization (EUA). This EUA will remain  in effect (meaning this test can be used) for the duration of the COVID-19 declaration under Section 564(b)(1) of the Act, 21 U.S.C.section 360bbb-3(b)(1), unless the authorization is terminated  or revoked sooner.       Influenza A by PCR NEGATIVE NEGATIVE Final   Influenza B by PCR NEGATIVE NEGATIVE Final    Comment: (NOTE) The Xpert Xpress SARS-CoV-2/FLU/RSV plus assay is intended as an aid in the diagnosis of influenza from Nasopharyngeal swab specimens and should not be used as a sole basis for treatment. Nasal washings and aspirates are unacceptable for Xpert Xpress SARS-CoV-2/FLU/RSV testing.  Fact Sheet for Patients: EntrepreneurPulse.com.au  Fact Sheet for Healthcare Providers: IncredibleEmployment.be  This test is not yet approved or cleared by the [place] FDA and has been authorized for detection and/or diagnosis of SARS-CoV-2 by FDA under an Emergency Use Authorization (EUA). This EUA will remain in effect (meaning this test can be used) for the duration of the COVID-19 declaration under Section 564(b)(1) of the Act, 21 U.S.C. section 360bbb-3(b)(1), unless the authorization is terminated or revoked.  Performed at [facility], [address] [place], [place] [address]      Radiological Exams on Admission: CT Abdomen Pelvis W Contrast  Result Date: 10/17/2020 CLINICAL DATA:  LEFT lower quadrant pain and diarrhea for 1 week. EXAM: CT ABDOMEN AND PELVIS WITH CONTRAST TECHNIQUE: Multidetector CT imaging of the abdomen and pelvis was performed using the standard protocol following bolus administration of intravenous contrast. CONTRAST:  78mL OMNIPAQUE IOHEXOL 300 MG/ML  SOLN COMPARISON:  None FINDINGS: Lower chest: Incidental imaging of the lung bases without effusion or  sign of consolidation. Hepatobiliary: No focal, suspicious hepatic lesion. Tiny hypodensity in the RIGHT hepatic lobe approximately 6 mm too small for definitive characterization but likely benign. Portal vein is patent. No pericholecystic stranding. No biliary duct distension. Pancreas: Normal, without mass, inflammation or ductal dilatation. Spleen: Normal size and contour without focal lesion. Adrenals/Urinary Tract: Adrenal glands are normal. Symmetric renal enhancement. No hydronephrosis. No suspicious renal lesion. Smooth contour the urinary bladder. Stomach/Bowel: No acute gastrointestinal process related to stomach or small bowel. The appendix is normal. Liquid stool is present throughout the colon. No pericolonic stranding or mural stratification of the colon. No sign of bowel obstruction. Vascular/Lymphatic: Patent abdominal vasculature. Normal caliber abdominal aorta. Smooth contour the IVC. There is no gastrohepatic or hepatoduodenal ligament lymphadenopathy. No retroperitoneal or mesenteric  lymphadenopathy. No pelvic sidewall lymphadenopathy. Reproductive: Uterus and adnexa unremarkable by CT. Other: No ascites.  No signs of free air. Musculoskeletal: No acute musculoskeletal process. No destructive bone finding. IMPRESSION: 1. Liquid stool is present throughout the colon. No pericolonic stranding or mural stratification of the colon. Findings are nonspecific but can be seen in the setting of diarrheal illness. No overt signs of colitis though given patient history consider correlation for any risk factors for C difficile with further evaluation as warranted. 2. Normal appendix. Electronically Signed   By: Zetta Bills M.D.   On: 10/17/2020 19:45    EKG: Independently reviewed.  Normal sinus rhythm with QTC of 454 ms.  Assessment/Plan Principal Problem:   Hypoglycemia associated with diabetes ([place]) Active Problems:   Pituitary microadenoma ([facility])   Schizoaffective disorder ([facility])    Hypoglycemia   Hypokalemia   Nausea vomiting and diarrhea   ARF (acute renal failure) ([place])    1. Recurrent hypoglycemia in the setting of recently started sulfonylurea with acute renal failure with poor oral intake likely causing the recurrent hypoglycemic episode.  Will discontinue sulfonylurea for now and continue with hydration follow CBGs closely.  In addition we will check also C-peptide levels cortisol levels and hemoglobin A1c. 2. Acute renal failure likely from diarrhea nausea vomiting and poor oral intake.  Hold lisinopril hydrochlorothiazide hydrate. 3. Nausea vomiting diarrhea could be from gastroenteritis.  Check GI pathogen panel.  Continue to monitor.  Hydrate. 4. History of hypertension presently blood pressures in the low normal's.  No definite signs of sepsis.  We will continue to hydrate and follow lactic acid levels would also check procalcitonin levels. 5. History of schizoaffective disorder on cariprazine Cogentin and lithium.  Lithium levels are pending. 6. Chronic anemia follow CBC. 7. Abnormal chest x-ray which showing opacity concerning for pneumonia but patient has no productive cough or shortness of breath or chest pain.  Procalcitonin has been ordered.  Continue to monitor. 8. Hyponatremia, hypokalemia and hypomagnesemia likely from dehydration and diarrhea which I think will improve with replacement and hydration.  Follow metabolic panel.  Given that patient has acute renal failure with the recurrent hypoglycemia with patient using sulfonylurea will need close monitoring for any further worsening in inpatient status.   DVT prophylaxis: Lovenox. Code Status: Full code. Family Communication: Discussed with patient. Disposition Plan: Home. Consults called: None. Admission status: Inpatient.   Rise Patience MD [facility] Pager [phone].  If 7PM-7AM, please contact night-coverage [email or web address] Password [facility]  10/17/2020, 11:57 PM

## 2020-10-17 NOTE — ED Notes (Signed)
Pt eating ice cream at this time. Drank apple juice without episodes of emesis

## 2020-10-17 NOTE — ED Provider Notes (Signed)
[facility] EMERGENCY DEPARTMENT Provider Note   CSN: [id number] Arrival date & time: 10/17/20  1635     History Chief Complaint  Patient presents with  . Hypoglycemia  . Diarrhea    Tracy Vaughn is a 45 y.o. female with pertinent past medical history of diabetes, schizoaffective disorder, history of hypoglycemia that presents to the emerge department today for hypoglycemia.  In regards to diabetes, patient is on Metformin, recently on glipizide 10 mg and glyburide 4 mg per PCP.  Patient states that she been compliant with all his medication.  Patient states for the last week she has noted that she has had diarrhea nausea vomiting.  States that she is able to episodes of diarrhea, has been able to intake much food due to consistent diarrhea.  States that she has been compliant with her blood pressure medications.  Denies any fevers or chills.  Denies any cough or URI symptoms.  Denies any myalgias.  Is having pain in her abdomen, specifically her left lower quadrant.  Denies any bloody vomit.  States that pain is worse when she vomits or has bowel movement.  Denies any recent antibiotics.  Denies any new vision changes, numbness or tingling.  Patient reportedly went to PCP today, told her to go to the ED for low sugar and low blood pressure.  Blood sugar was initially 48, EMS started her on D10.  Repeat CBG was 94, however by the time they got her in the room sugar had dropped down to 45.  Not actively vomiting.  Denies any chest pain or shortness of breath.  States that she has had 1 vaccine of COVID-19, denies any sick contacts.  HPI     Past Medical History:  Diagnosis Date  . Diabetes mellitus without complication ([place])   . Mental impairment   . Schizoaffective disorder [facility])     Patient Active Problem List   Diagnosis Date Noted  . Hypoglycemia associated with diabetes ([place]) 10/17/2020  . Schizoaffective disorder ([place]) 11/28/2019  . Hypoglycemia 11/28/2019  . COVID-19  virus infection 11/28/2019  . Hyperprolactinemia ([place]) 12/08/2017  . Pituitary microadenoma ([place]) 12/08/2017  . Type 2 diabetes mellitus with complication, without long-term current use of insulin ([place]) 07/30/2017    No past surgical history on file.   OB History   No obstetric history on file.     No family history on file.  Social History   Tobacco Use  . Smoking status: Never Smoker  . Smokeless tobacco: Never Used  Substance Use Topics  . Alcohol use: No    Home Medications Prior to Admission medications   Medication Sig Start Date End Date Taking? Authorizing Provider  propranolol (INDERAL) 20 MG tablet Take 20 mg by mouth 2 (two) times daily.   Yes [provider]  benztropine (COGENTIN) 2 MG tablet Take 1 tablet (2 mg total) by mouth at bedtime. 10/14/18   Daleen Bo, MD  cariprazine (VRAYLAR) capsule Take 1.5 mg by mouth daily.    [provider]  glimepiride (AMARYL) 4 MG tablet Take 4 mg by mouth 2 (two) times daily. 08/27/20   [provider]  glipiZIDE (GLUCOTROL) 10 MG tablet Take 10 mg by mouth daily. 10/09/20   [provider]  lisinopril-hydrochlorothiazide (ZESTORETIC) 20-25 MG tablet Take 1 tablet by mouth daily. 10/17/19   [provider]  lithium carbonate 300 MG capsule Take 300-600 mg by mouth See admin instructions. Take 1 capsule in the morning and 2 capsule at  bedtime.    [provider]  metFORMIN (GLUCOPHAGE) 1000 MG tablet Take 1,000 mg by mouth 2 (two) times daily. 10/17/19   [provider]  omeprazole (PRILOSEC) 20 MG capsule Take 20 mg by mouth daily.    [provider]  rosuvastatin (CRESTOR) 40 MG tablet Take 40 mg by mouth at bedtime. 08/31/20   [provider]  venlafaxine XR (EFFEXOR-XR) 75 MG 24 hr capsule Take 75 mg by mouth daily.    [provider]    Allergies    Fanapt [iloperidone] and Procaine  Review of Systems   Review of Systems  Physical  Exam Updated Vital Signs BP 115/65   Pulse 79   Temp 98.8 F (37.1 C)   Resp (!) 22   Ht 5\' 6"  (1.676 m)   Wt 123.4 kg   LMP 10/18/2018   SpO2 99%   BMI 43.90 kg/m   Physical Exam Constitutional:      General: She is not in acute distress.    Appearance: Normal appearance. She is not ill-appearing, toxic-appearing or diaphoretic.  HENT:     Mouth/Throat:     Mouth: Mucous membranes are moist.     Pharynx: Oropharynx is clear.  Eyes:     General: No scleral icterus.    Extraocular Movements: Extraocular movements intact.     Pupils: Pupils are equal, round, and reactive to light.     Comments: Patient does often have gaze preference to the ceiling, however is able to follow my finger normally with normal visual acuity.  Patient will look at me when I am talking, however her eyes will continue go back up.  I did discuss this with patient, she states that this is normal for her.  Cardiovascular:     Rate and Rhythm: Normal rate and regular rhythm.     Pulses: Normal pulses.     Heart sounds: Normal heart sounds.  Pulmonary:     Effort: Pulmonary effort is normal. No respiratory distress.     Breath sounds: Normal breath sounds. No stridor. No wheezing, rhonchi or rales.  Chest:     Chest wall: No tenderness.  Abdominal:     General: Abdomen is flat. There is no distension.     Palpations: Abdomen is soft.     Tenderness: There is no abdominal tenderness. There is no guarding or rebound.  Musculoskeletal:        General: No swelling or tenderness. Normal range of motion.     Cervical back: Normal range of motion and neck supple. No rigidity.     Right lower leg: No edema.     Left lower leg: No edema.  Skin:    General: Skin is warm and dry.     Capillary Refill: Capillary refill takes less than 2 seconds.     Coloration: Skin is not pale.  Neurological:     General: No focal deficit present.     Mental Status: She is alert and oriented to person, place, and time.      Comments: Alert. Clear speech. No facial droop. CNIII-XII grossly intact. Bilateral upper and lower extremities' sensation grossly intact. 5/5 symmetric strength with grip strength and with plantar and dorsi flexion bilaterally. . Normal finger to nose bilaterally. Negative pronator drift.  Patient does have some jerking movements, states is common for her.  Psychiatric:        Mood and Affect: Mood normal.        Behavior: Behavior normal.  ED Results / Procedures / Treatments   Labs (all labs ordered are listed, but only abnormal results are displayed) Labs Reviewed  CBC WITH DIFFERENTIAL/PLATELET - Abnormal; Notable for the following components:      Result Value   WBC 17.5 (*)    Hemoglobin 11.5 (*)    MCV 78.4 (*)    MCH 24.4 (*)    RDW 15.9 (*)    Platelets 491 (*)    Neutro Abs 12.7 (*)    Monocytes Absolute 1.1 (*)    All other components within normal limits  COMPREHENSIVE METABOLIC PANEL - Abnormal; Notable for the following components:   Sodium 128 (*)    Potassium 2.6 (*)    Chloride 95 (*)    CO2 21 (*)    Glucose, Bld 46 (*)    Creatinine, Ser 1.81 (*)    Calcium 8.3 (*)    Total Protein 8.8 (*)    Albumin 3.4 (*)    Total Bilirubin 0.2 (*)    GFR, Estimated 35 (*)    All other components within normal limits  URINALYSIS, ROUTINE W REFLEX MICROSCOPIC - Abnormal; Notable for the following components:   Leukocytes,Ua SMALL (*)    All other components within normal limits  MAGNESIUM - Abnormal; Notable for the following components:   Magnesium 1.5 (*)    All other components within normal limits  URINALYSIS, MICROSCOPIC (REFLEX) - Abnormal; Notable for the following components:   Bacteria, UA RARE (*)    All other components within normal limits  CBG MONITORING, ED - Abnormal; Notable for the following components:   Glucose-Capillary 46 (*)    All other components within normal limits  CBG MONITORING, ED - Abnormal; Notable for the following components:    Glucose-Capillary 116 (*)    All other components within normal limits  CBG MONITORING, ED - Abnormal; Notable for the following components:   Glucose-Capillary 66 (*)    All other components within normal limits  CBG MONITORING, ED - Abnormal; Notable for the following components:   Glucose-Capillary 64 (*)    All other components within normal limits  CBG MONITORING, ED - Abnormal; Notable for the following components:   Glucose-Capillary 57 (*)    All other components within normal limits  CBG MONITORING, ED - Abnormal; Notable for the following components:   Glucose-Capillary 119 (*)    All other components within normal limits  RESP PANEL BY RT-PCR (FLU A&B, COVID) ARPGX2  GASTROINTESTINAL PANEL BY PCR, STOOL (REPLACES STOOL CULTURE)  C DIFFICILE QUICK SCREEN W PCR REFLEX  PREGNANCY, URINE  LIPASE, BLOOD  LACTIC ACID, PLASMA  CBG MONITORING, ED  CBG MONITORING, ED  CBG MONITORING, ED  CBG MONITORING, ED    EKG None  Radiology CT Abdomen Pelvis W Contrast  Result Date: 10/17/2020 CLINICAL DATA:  LEFT lower quadrant pain and diarrhea for 1 week. EXAM: CT ABDOMEN AND PELVIS WITH CONTRAST TECHNIQUE: Multidetector CT imaging of the abdomen and pelvis was performed using the standard protocol following bolus administration of intravenous contrast. CONTRAST:  23mL OMNIPAQUE IOHEXOL 300 MG/ML  SOLN COMPARISON:  None FINDINGS: Lower chest: Incidental imaging of the lung bases without effusion or sign of consolidation. Hepatobiliary: No focal, suspicious hepatic lesion. Tiny hypodensity in the RIGHT hepatic lobe approximately 6 mm too small for definitive characterization but likely benign. Portal vein is patent. No pericholecystic stranding. No biliary duct distension. Pancreas: Normal, without mass, inflammation or ductal dilatation. Spleen: Normal size and contour without focal  lesion. Adrenals/Urinary Tract: Adrenal glands are normal. Symmetric renal enhancement. No hydronephrosis.  No suspicious renal lesion. Smooth contour the urinary bladder. Stomach/Bowel: No acute gastrointestinal process related to stomach or small bowel. The appendix is normal. Liquid stool is present throughout the colon. No pericolonic stranding or mural stratification of the colon. No sign of bowel obstruction. Vascular/Lymphatic: Patent abdominal vasculature. Normal caliber abdominal aorta. Smooth contour the IVC. There is no gastrohepatic or hepatoduodenal ligament lymphadenopathy. No retroperitoneal or mesenteric lymphadenopathy. No pelvic sidewall lymphadenopathy. Reproductive: Uterus and adnexa unremarkable by CT. Other: No ascites.  No signs of free air. Musculoskeletal: No acute musculoskeletal process. No destructive bone finding. IMPRESSION: 1. Liquid stool is present throughout the colon. No pericolonic stranding or mural stratification of the colon. Findings are nonspecific but can be seen in the setting of diarrheal illness. No overt signs of colitis though given patient history consider correlation for any risk factors for C difficile with further evaluation as warranted. 2. Normal appendix. Electronically Signed   By: Zetta Bills M.D.   On: 10/17/2020 19:45    Procedures .Critical Care Performed by: Alfredia Client, PA-C Authorized by: Alfredia Client, PA-C   Critical care provider statement:    Critical care time (minutes):  45   Critical care was necessary to treat or prevent imminent or life-threatening deterioration of the following conditions:  Metabolic crisis   Critical care was time spent personally by me on the following activities:  Discussions with consultants, evaluation of patient's response to treatment, examination of patient, ordering and performing treatments and interventions, ordering and review of laboratory studies, ordering and review of radiographic studies, pulse oximetry, re-evaluation of patient's condition, obtaining history from patient or surrogate and review of old  charts     Medications Ordered in ED Medications  dextrose 10 % infusion ( Intravenous Restarted 10/17/20 2056)  dextrose 50 % solution 50 mL (50 mLs Intravenous Given 10/17/20 1710)  dextrose 5 % solution (0 mLs Intravenous Stopped 10/17/20 1808)  potassium chloride 10 mEq in 100 mL IVPB (0 mEq Intravenous Stopped 10/17/20 2035)  potassium chloride SA (KLOR-CON) CR tablet 40 mEq (40 mEq Oral Given 10/17/20 1743)  lactated ringers bolus 1,000 mL (0 mLs Intravenous Stopped 10/17/20 1812)  magnesium sulfate IVPB 2 g 50 mL (0 g Intravenous Stopped 10/17/20 2035)  lactated ringers bolus 1,000 mL (0 mLs Intravenous Stopped 10/17/20 2037)  iohexol (OMNIPAQUE) 300 MG/ML solution 100 mL (80 mLs Intravenous Contrast Given 10/17/20 1839)  dextrose 50 % solution 50 mL (50 mLs Intravenous Given 10/17/20 1944)  octreotide (SANDOSTATIN) injection 50 mcg (50 mcg Subcutaneous Given 10/17/20 2000)    ED Course  I have reviewed the triage vital signs and the nursing notes.  Pertinent labs & imaging results that were available during my care of the patient were reviewed by me and considered in my medical decision making (see chart for details).    MDM Rules/Calculators/A&P                         Tracy Vaughn is a 45 y.o. female with pertinent past medical history of diabetes, schizoaffective disorder, history of hypoglycemia that presents to the emerge department today for hypoglycemia.  Patient sugar continuously dropping, did give amp of D50 went up to 125.  Patient sugar then dropped down to 88 minutes later, initiated infusion of D5.  Patient will most likely need to be coming in for continuous hypoglycemia.  Patient does not appear septic.  Potassium 2.6, has been being repleted here in the emergency department along with magnesium.  Patient's blood sugar keeps dropping, have tried D5 infusion escalated to D10 infusion, have given 2 A of D50.  I think this is most likely from unintentional overdose of  sulfonylurea, will give 50 mcg of octreotide at this time.  Patient continues to have unchanged neuro exam.  After octreotide, glucose has remained above 90 an hour later.  Repeat glucose 119.  Patient is stable to be admitted at this time.  CT abdomen pelvis shows diarrhea, stool samples have been collected.  1008 patient admitted to Dr. Orlin Hilding, will accept patient.  Patient is stable. BP 115/65 before transfer.   The patient appears reasonably stabilized for admission considering the current resources, flow, and capabilities available in the ED at this time, and I doubt any other [facility] requiring further screening and/or treatment in the ED prior to admission.  I discussed this case with my attending physician who cosigned this note including patient's presenting symptoms, physical exam, and planned diagnostics and interventions. Attending physician stated agreement with plan or made changes to plan which were implemented.   Attending physician assessed patient at bedside.    Final Clinical Impression(s) / ED Diagnoses Final diagnoses:  Hypoglycemia  Nausea vomiting and diarrhea  Hypokalemia    Rx / DC Orders ED Discharge Orders    None       Alfredia Client, PA-C 10/17/20 2258    Davonna Belling, MD 10/17/20 [phone]

## 2020-10-17 NOTE — ED Notes (Signed)
BG of 70 from 1900. Did not cross over to epic at correct time

## 2020-10-17 NOTE — ED Notes (Signed)
IV removed by patient when turning in bed. Blood dripping onto floor. Pt cleaned up

## 2020-10-17 NOTE — Progress Notes (Signed)
Accepted to step down bed - for close monitoring of glycemic status - at [facility]. [facility] hospitalists to assume care upon arrival to accepting facility.

## 2020-10-18 DIAGNOSIS — E11649 Type 2 diabetes mellitus with hypoglycemia without coma: Secondary | ICD-10-CM | POA: Diagnosis not present

## 2020-10-18 LAB — GASTROINTESTINAL PANEL BY PCR, STOOL (REPLACES STOOL CULTURE)
Adenovirus F40/41: NOT DETECTED
Astrovirus: NOT DETECTED
Campylobacter species: NOT DETECTED
Cryptosporidium: NOT DETECTED
Cyclospora cayetanensis: NOT DETECTED
Entamoeba histolytica: NOT DETECTED
Enteroaggregative E coli (EAEC): DETECTED — AB
Enteropathogenic E coli (EPEC): DETECTED — AB
Enterotoxigenic E coli (ETEC): NOT DETECTED
Giardia lamblia: NOT DETECTED
Norovirus GI/GII: NOT DETECTED
Plesimonas shigelloides: NOT DETECTED
Rotavirus A: NOT DETECTED
Salmonella species: NOT DETECTED
Sapovirus (I, II, IV, and V): NOT DETECTED
Shiga like toxin producing E coli (STEC): NOT DETECTED
Shigella/Enteroinvasive E coli (EIEC): NOT DETECTED
Vibrio cholerae: NOT DETECTED
Vibrio species: NOT DETECTED
Yersinia enterocolitica: NOT DETECTED

## 2020-10-18 LAB — CBC WITH DIFFERENTIAL/PLATELET
Abs Immature Granulocytes: 0.04 10*3/uL (ref 0.00–0.07)
Basophils Absolute: 0 10*3/uL (ref 0.0–0.1)
Basophils Relative: 0 %
Eosinophils Absolute: 0.1 10*3/uL (ref 0.0–0.5)
Eosinophils Relative: 1 %
HCT: 33.8 % — ABNORMAL LOW (ref 36.0–46.0)
Hemoglobin: 10.5 g/dL — ABNORMAL LOW (ref 12.0–15.0)
Immature Granulocytes: 0 %
Lymphocytes Relative: 27 %
Lymphs Abs: 3.5 10*3/uL (ref 0.7–4.0)
MCH: 24.1 pg — ABNORMAL LOW (ref 26.0–34.0)
MCHC: 31.1 g/dL (ref 30.0–36.0)
MCV: 77.7 fL — ABNORMAL LOW (ref 80.0–100.0)
Monocytes Absolute: 1.2 10*3/uL — ABNORMAL HIGH (ref 0.1–1.0)
Monocytes Relative: 9 %
Neutro Abs: 8 10*3/uL — ABNORMAL HIGH (ref 1.7–7.7)
Neutrophils Relative %: 63 %
Platelets: 387 10*3/uL (ref 150–400)
RBC: 4.35 MIL/uL (ref 3.87–5.11)
RDW: 15.8 % — ABNORMAL HIGH (ref 11.5–15.5)
WBC: 12.8 10*3/uL — ABNORMAL HIGH (ref 4.0–10.5)
nRBC: 0 % (ref 0.0–0.2)

## 2020-10-18 LAB — COMPREHENSIVE METABOLIC PANEL
ALT: 16 U/L (ref 0–44)
AST: 22 U/L (ref 15–41)
Albumin: 3.1 g/dL — ABNORMAL LOW (ref 3.5–5.0)
Alkaline Phosphatase: 62 U/L (ref 38–126)
Anion gap: 8 (ref 5–15)
BUN: 12 mg/dL (ref 6–20)
CO2: 22 mmol/L (ref 22–32)
Calcium: 8.5 mg/dL — ABNORMAL LOW (ref 8.9–10.3)
Chloride: 102 mmol/L (ref 98–111)
Creatinine, Ser: 1.22 mg/dL — ABNORMAL HIGH (ref 0.44–1.00)
GFR, Estimated: 56 mL/min — ABNORMAL LOW (ref >60)
Glucose, Bld: 127 mg/dL — ABNORMAL HIGH (ref 70–99)
Potassium: 2.8 mmol/L — ABNORMAL LOW (ref 3.5–5.1)
Sodium: 132 mmol/L — ABNORMAL LOW (ref 135–145)
Total Bilirubin: 0.3 mg/dL (ref 0.3–1.2)
Total Protein: 7.5 g/dL (ref 6.5–8.1)

## 2020-10-18 LAB — CORTISOL: Cortisol, Plasma: 15.9 ug/dL

## 2020-10-18 LAB — CBC
HCT: 35.9 % — ABNORMAL LOW (ref 36.0–46.0)
Hemoglobin: 11 g/dL — ABNORMAL LOW (ref 12.0–15.0)
MCH: 24 pg — ABNORMAL LOW (ref 26.0–34.0)
MCHC: 30.6 g/dL (ref 30.0–36.0)
MCV: 78.2 fL — ABNORMAL LOW (ref 80.0–100.0)
Platelets: 392 10*3/uL (ref 150–400)
RBC: 4.59 MIL/uL (ref 3.87–5.11)
RDW: 15.9 % — ABNORMAL HIGH (ref 11.5–15.5)
WBC: 13.8 10*3/uL — ABNORMAL HIGH (ref 4.0–10.5)
nRBC: 0 % (ref 0.0–0.2)

## 2020-10-18 LAB — PROCALCITONIN: Procalcitonin: <0.1 ng/mL

## 2020-10-18 LAB — GLUCOSE, CAPILLARY
Glucose-Capillary: 166 mg/dL — ABNORMAL HIGH (ref 70–99)
Glucose-Capillary: 137 mg/dL — ABNORMAL HIGH (ref 70–99)
Glucose-Capillary: 131 mg/dL — ABNORMAL HIGH (ref 70–99)
Glucose-Capillary: 111 mg/dL — ABNORMAL HIGH (ref 70–99)
Glucose-Capillary: 116 mg/dL — ABNORMAL HIGH (ref 70–99)
Glucose-Capillary: 142 mg/dL — ABNORMAL HIGH (ref 70–99)
Glucose-Capillary: 147 mg/dL — ABNORMAL HIGH (ref 70–99)
Glucose-Capillary: 126 mg/dL — ABNORMAL HIGH (ref 70–99)
Glucose-Capillary: 135 mg/dL — ABNORMAL HIGH (ref 70–99)
Glucose-Capillary: 239 mg/dL — ABNORMAL HIGH (ref 70–99)
Glucose-Capillary: 251 mg/dL — ABNORMAL HIGH (ref 70–99)
Glucose-Capillary: 313 mg/dL — ABNORMAL HIGH (ref 70–99)
Glucose-Capillary: 288 mg/dL — ABNORMAL HIGH (ref 70–99)
Glucose-Capillary: 281 mg/dL — ABNORMAL HIGH (ref 70–99)

## 2020-10-18 LAB — CBG MONITORING, ED: Glucose-Capillary: 186 mg/dL — ABNORMAL HIGH (ref 70–99)

## 2020-10-18 LAB — LACTIC ACID, PLASMA
Lactic Acid, Venous: 2.1 mmol/L (ref 0.5–1.9)
Lactic Acid, Venous: 3.1 mmol/L (ref 0.5–1.9)
Lactic Acid, Venous: 2.8 mmol/L (ref 0.5–1.9)

## 2020-10-18 LAB — PHOSPHORUS: Phosphorus: 1.9 mg/dL — ABNORMAL LOW (ref 2.5–4.6)

## 2020-10-18 LAB — CREATININE, SERUM
Creatinine, Ser: 1.25 mg/dL — ABNORMAL HIGH (ref 0.44–1.00)
GFR, Estimated: 55 mL/min — ABNORMAL LOW (ref >60)

## 2020-10-18 LAB — MAGNESIUM: Magnesium: 1.9 mg/dL (ref 1.7–2.4)

## 2020-10-18 LAB — HEMOGLOBIN A1C
Hgb A1c MFr Bld: 7.6 % — ABNORMAL HIGH (ref 4.8–5.6)
Mean Plasma Glucose: 171.42 mg/dL

## 2020-10-18 LAB — C DIFFICILE QUICK SCREEN W PCR REFLEX
C Diff antigen: NEGATIVE
C Diff interpretation: NOT DETECTED
C Diff toxin: NEGATIVE

## 2020-10-18 LAB — LITHIUM LEVEL: Lithium Lvl: 0.67 mmol/L (ref 0.60–1.20)

## 2020-10-18 MED ORDER — POTASSIUM CHLORIDE 20 MEQ PO PACK
40.0000 meq | PACK | Freq: Once | ORAL | Status: AC
  Administered 2020-10-18: 40 meq via ORAL
  Filled 2020-10-18: qty 2

## 2020-10-18 MED ORDER — INSULIN ASPART 100 UNIT/ML ~~LOC~~ SOLN
0.0000 [IU] | Freq: Every day | SUBCUTANEOUS | Status: DC
  Administered 2020-10-18: 3 [IU] via SUBCUTANEOUS

## 2020-10-18 MED ORDER — POTASSIUM CHLORIDE CRYS ER 20 MEQ PO TBCR
40.0000 meq | EXTENDED_RELEASE_TABLET | ORAL | Status: AC
  Administered 2020-10-18 (×2): 40 meq via ORAL
  Filled 2020-10-18 (×2): qty 2

## 2020-10-18 MED ORDER — INSULIN ASPART 100 UNIT/ML ~~LOC~~ SOLN
0.0000 [IU] | Freq: Three times a day (TID) | SUBCUTANEOUS | Status: DC
  Administered 2020-10-18: 5 [IU] via SUBCUTANEOUS
  Administered 2020-10-19 (×2): 2 [IU] via SUBCUTANEOUS
  Administered 2020-10-19: 5 [IU] via SUBCUTANEOUS
  Administered 2020-10-20: 2 [IU] via SUBCUTANEOUS

## 2020-10-18 MED ORDER — LITHIUM CARBONATE 300 MG PO CAPS
300.0000 mg | ORAL_CAPSULE | Freq: Every day | ORAL | Status: DC
  Administered 2020-10-18 – 2020-10-20 (×3): 300 mg via ORAL
  Filled 2020-10-18 (×3): qty 1

## 2020-10-18 MED ORDER — LITHIUM CARBONATE 300 MG PO CAPS
600.0000 mg | ORAL_CAPSULE | Freq: Every day | ORAL | Status: DC
  Administered 2020-10-18 – 2020-10-19 (×2): 600 mg via ORAL
  Filled 2020-10-18 (×3): qty 2

## 2020-10-18 NOTE — Progress Notes (Signed)
Inpatient Diabetes Program Recommendations  AACE/ADA: New Consensus Statement on Inpatient Glycemic Control (2015)  Target Ranges:  Prepandial:   less than 140 mg/dL      Peak postprandial:   less than 180 mg/dL (1-2 hours)      Critically ill patients:  140 - 180 mg/dL   Lab Results  Component Value Date   GLUCAP 251 (H) 10/18/2020   HGBA1C 7.6 (H) 10/18/2020    Review of Glycemic Control  Diabetes history: DM2 Outpatient Diabetes medications: metformin 1000 mg BID, glipizide 10 mg QD, glimipiride 4 mg BID Current orders for Inpatient glycemic control: Novolog 0-9 units TID with meals and 0-5 HS  HgbA1C - 7.6%  Inpatient Diabetes Program Recommendations:     Agree with orders.  Spoke with pt regarding her diabetes control at home. Pt states she checks her blood sugars regularly at home. Was previously on metformin when diagnosed with diabetes. Started glipizide in January, and glimeipiride in March 2022. States she's had hypoglycemia since starting the glimepiride. Did not have hypos prior to starting this med in March.   Would not restart glimepiride at discharge. Pt to f/u with PCP.  Continue to follow while inpatient.  Thank you. Lorenda Peck, RD, LDN, CDE Inpatient Diabetes Coordinator [phone]

## 2020-10-18 NOTE — Progress Notes (Signed)
PROGRESS NOTE  Tracy Vaughn  DOB: 1975/11/24  PCP: Benito Mccreedy, MD LFY:[id number]  DOA: 10/17/2020  LOS: 1 day   Chief Complaint  Patient presents with  . Hypoglycemia  . Diarrhea   Brief narrative: Tracy Vaughn is a 45 y.o. female with PMH significant for T2DM HTN, schizoaffective disorder.   Patient presented to the ED on 3/16 with complaint of nausea, vomiting, and diarrhea for 7 to 10 days after he was newly started on an oral antidiabetic medication (either glyburide or glipizide).she first went to her PCP, found to have low blood pressure and low sugar and hence referred to ED.   In the ED, hemodynamically stable Lab with sodium level low at 128, potassium low at 2.6, BUN/creatinine 20/1.81, glucose level low at 46, magnesium low at 1.5 WBC count elevated to 17.5 CT abdomen and pelvis showed liquid stool throughout the colon without overt signs of colitis. Admitted under hospitalist service for further evaluation and management  Subjective: Patient was seen and examined this morning.  Young African-American female.  Propped up in bed.  Not in distress. Labs from this morning with potassium still low at 2.8, creatinine improving 1.22, lactic acid level elevated to 3.1, procalcitonin negative, WBC count improving to 12.8  Assessment/Plan: Acute gastroenteritis -Presented with nausea, vomiting or diarrhea for 7 to 10 days.  Incidentally symptoms started after taking sulfonylurea.  It is unclear at this time if his diarrhea is infectious versus noninfectious in origin.   -No fever, but elevated WBC count and lactic acid levels.  Lactic acid elevation could be due to dehydration as well. -Not on antibiotics.  Continue symptomatic management with IV fluid, antiemetics. -Repeat labs tomorrow. Recent Labs  Lab 10/17/20 1700 10/17/20 2226 10/18/20 0034 10/18/20 0247 10/18/20 0704  WBC 17.5*  --  13.8* 12.8*  --   LATICACIDVEN  --  2.1*  --   --  3.1*  PROCALCITON  --   --    --   --  <0.10   Recurrent hypoglycemia Type 2 diabetes mellitus -A1c 7.6 on 3/17. -Home meds include Glimepiride 4 mg twice daily, glipizide 10 mg daily, Metformin 1000 mg twice daily.  I am not clear at this time if patient was meant to be on 2 different sulfonylureas or was switched from 1 to the other.  Patient believes that she was supposed to continue all. -Her blood sugar might have ran low because of the same. -Patient's blood sugar level was persistently low in the ED.  Dextrose was given.  Currently on normal saline drip  -Keep oral meds on hold.  Continue sliding cell insulin with Accu-Cheks.   Recent Labs  Lab 10/18/20 0302 10/18/20 0357 10/18/20 0458 10/18/20 0557 10/18/20 0700  GLUCAP 131* 111* 116* 142* 147*   Hypotension History of essential hypertension  -Home meds include Lisinopril 20 mg daily, HCTZ 25 mg daily. -Blood pressure running low to low normal range.  Keep both meds on hold. -Continue to monitor blood pressure with IV fluid.  AKI  Lactic acidosis -Secondary to dehydration due to diarrhea loss. Also on HCTZ and lisinopril  -Continue to monitor diarrheal loss and replenish with IV fluid.  Encourage oral hydration.  Keep HCTZ and lisinopril on hold.  Monitor creatinine Recent Labs    11/28/19 2119 11/30/19 0741 12/01/19 0458 10/17/20 1700 10/18/20 0034 10/18/20 0247  BUN 8 11 10 20   --  12  CREATININE 1.08* 0.95 0.91 1.81* 1.25* 1.22*   Hypokalemia/hypomagnesemia -Electrolyte levels because of  diarrhea loss.   -Potassium level was low at 2.6 on admission.  Replaced.  Remains low at 2.8 this morning.  Continue placement. -Magnesium level improving with replacement. -Add phosphorus level. Recent Labs  Lab 10/17/20 1700 10/17/20 1730 10/18/20 0247  K 2.6*  --  2.8*  MG  --  1.5* 1.9   Hyponatremia -Hypovolemic.  Continue to monitor with hydration Recent Labs  Lab 10/17/20 1700 10/18/20 0247  NA 128* 132*   Chronic microcytic  anemia -Obtain anemia panel -Continue monitor hemoglobin Recent Labs    11/30/19 0741 12/01/19 0458 10/17/20 1700 10/18/20 0034 10/18/20 0247  HGB 10.2* 10.6* 11.5* 11.0* 10.5*  MCV 83.1 81.1 78.4* 78.2* 77.7*  FERRITIN  --  100  --   --   --    History of schizoaffective disorder  -Home meds include benztropine, cariprazine, venlafaxine 75 mg daily, propranolol 20 mg twice daily, Lithium 300 mg in the morning and 600 mg at bedtime -Resume gradually   GERD -Prilosec 20 mg daily,  Hyperlipidemia -Crestor 40 mg at bedtime  Mobility: Encourage ambulation.  Independent at baseline Code Status:   Code Status: Full Code  Nutritional status: Body mass index is 44.05 kg/m.     Diet Order            Diet heart healthy/carb modified Room service appropriate? Yes; Fluid consistency: Thin  Diet effective now                 DVT prophylaxis: Lovenox subcu   Antimicrobials:  None Fluid: Normal saline at 100 mill per hour Consultants: None Family Communication:  None at bedside  Status is: Inpatient  Remains inpatient appropriate because: Needs IV fluid, electrolyte replacement.  Diarrhea controlled  Dispo: The patient is from: Home              Anticipated d/c is to: Home              Patient currently is not medically stable to d/c.   Difficult to place patient No       Infusions:  . sodium chloride 100 mL/hr at 10/18/20 0400    Scheduled Meds: . benztropine  2 mg Oral QHS  . cariprazine  1.5 mg Oral Daily  . Chlorhexidine Gluconate Cloth  6 each Topical Daily  . enoxaparin (LOVENOX) injection  60 mg Subcutaneous Q24H  . lithium carbonate  300 mg Oral Q breakfast   And  . lithium carbonate  600 mg Oral Q supper  . pantoprazole  40 mg Oral Daily  . [START ON 10/19/2020] rosuvastatin  40 mg Oral QHS  . venlafaxine XR  75 mg Oral Daily    Antimicrobials: Anti-infectives (From admission, onward)   None      PRN meds: acetaminophen **OR**  acetaminophen   Objective: Vitals:   10/18/20 0400 10/18/20 0800  BP: 94/62   Pulse: 90 (!) 114  Resp: (!) 22 (!) 24  Temp: 98.6 F (37 C)   SpO2: 98% 98%    Intake/Output Summary (Last 24 hours) at 10/18/2020 0825 Last data filed at 10/18/2020 0400 Gross per 24 hour  Intake 2528.45 ml  Output --  Net 2528.45 ml   Filed Weights   10/17/20 1652 10/17/20 2329  Weight: 123.4 kg 123.8 kg   Weight change:  Body mass index is 44.05 kg/m.   Physical Exam: General exam: Pleasant young African-American female. Skin: No rashes, lesions or ulcers. HEENT: Atraumatic, normocephalic, no obvious bleeding Lungs: Clear to auscultation  bilaterally CVS: Regular rate and rhythm, no murmur GI/Abd soft, nontender, nondistended, bowel sound present CNS: Alert, awake, oriented x3 Psychiatry: Mood appropriate Extremities: No pedal edema, no calf tenderness  Data Review: I have personally reviewed the laboratory data and studies available.  Recent Labs  Lab 10/17/20 1700 10/18/20 0034 10/18/20 0247  WBC 17.5* 13.8* 12.8*  NEUTROABS 12.7*  --  8.0*  HGB 11.5* 11.0* 10.5*  HCT 37.0 35.9* 33.8*  MCV 78.4* 78.2* 77.7*  PLT 491* 392 387   Recent Labs  Lab 10/17/20 1700 10/17/20 1730 10/18/20 0034 10/18/20 0247  NA 128*  --   --  132*  K 2.6*  --   --  2.8*  CL 95*  --   --  102  CO2 21*  --   --  22  GLUCOSE 46*  --   --  127*  BUN 20  --   --  12  CREATININE 1.81*  --  1.25* 1.22*  CALCIUM 8.3*  --   --  8.5*  MG  --  1.5*  --  1.9    F/u labs ordered Unresulted Labs (From admission, onward)          Start     Ordered   10/24/20 0500  Creatinine, serum  (enoxaparin (LOVENOX)    CrCl >/= 30 ml/min)  Weekly,   R     Comments: while on enoxaparin therapy    10/17/20 2355   10/18/20 0446  Lactic acid, plasma  STAT Now then every 3 hours,   R (with STAT occurrences)     Question:  Specimen collection method  Answer:  Lab=Lab collect   10/18/20 0445   10/17/20 2355   C-peptide  ONCE - STAT,   STAT        10/17/20 2355   10/17/20 1723  Gastrointestinal Panel by PCR , Stool  (Gastrointestinal Panel by PCR, Stool                                                                                                                                                     **Does Not include CLOSTRIDIUM DIFFICILE testing. **If CDIFF testing is needed, place order from the "C Difficile Testing" order set.**)  Once,   R        10/17/20 1722          Signed, Terrilee Croak, MD [facility] 10/18/2020

## 2020-10-19 DIAGNOSIS — E876 Hypokalemia: Secondary | ICD-10-CM

## 2020-10-19 DIAGNOSIS — N179 Acute kidney failure, unspecified: Secondary | ICD-10-CM | POA: Diagnosis not present

## 2020-10-19 DIAGNOSIS — R112 Nausea with vomiting, unspecified: Secondary | ICD-10-CM

## 2020-10-19 DIAGNOSIS — F250 Schizoaffective disorder, bipolar type: Secondary | ICD-10-CM

## 2020-10-19 DIAGNOSIS — A09 Infectious gastroenteritis and colitis, unspecified: Secondary | ICD-10-CM | POA: Diagnosis present

## 2020-10-19 DIAGNOSIS — R197 Diarrhea, unspecified: Secondary | ICD-10-CM

## 2020-10-19 DIAGNOSIS — E11649 Type 2 diabetes mellitus with hypoglycemia without coma: Secondary | ICD-10-CM | POA: Diagnosis not present

## 2020-10-19 DIAGNOSIS — E162 Hypoglycemia, unspecified: Secondary | ICD-10-CM | POA: Diagnosis not present

## 2020-10-19 LAB — CBC WITH DIFFERENTIAL/PLATELET
Abs Immature Granulocytes: 0.02 10*3/uL (ref 0.00–0.07)
Basophils Absolute: 0 10*3/uL (ref 0.0–0.1)
Basophils Relative: 0 %
Eosinophils Absolute: 0.1 10*3/uL (ref 0.0–0.5)
Eosinophils Relative: 2 %
HCT: 32 % — ABNORMAL LOW (ref 36.0–46.0)
Hemoglobin: 9.8 g/dL — ABNORMAL LOW (ref 12.0–15.0)
Immature Granulocytes: 0 %
Lymphocytes Relative: 41 %
Lymphs Abs: 3.5 10*3/uL (ref 0.7–4.0)
MCH: 24.5 pg — ABNORMAL LOW (ref 26.0–34.0)
MCHC: 30.6 g/dL (ref 30.0–36.0)
MCV: 80 fL (ref 80.0–100.0)
Monocytes Absolute: 0.9 10*3/uL (ref 0.1–1.0)
Monocytes Relative: 11 %
Neutro Abs: 4 10*3/uL (ref 1.7–7.7)
Neutrophils Relative %: 46 %
Platelets: 353 10*3/uL (ref 150–400)
RBC: 4 MIL/uL (ref 3.87–5.11)
RDW: 16 % — ABNORMAL HIGH (ref 11.5–15.5)
WBC: 8.6 10*3/uL (ref 4.0–10.5)
nRBC: 0 % (ref 0.0–0.2)

## 2020-10-19 LAB — FERRITIN: Ferritin: 28 ng/mL (ref 11–307)

## 2020-10-19 LAB — GLUCOSE, CAPILLARY
Glucose-Capillary: 199 mg/dL — ABNORMAL HIGH (ref 70–99)
Glucose-Capillary: 274 mg/dL — ABNORMAL HIGH (ref 70–99)
Glucose-Capillary: 189 mg/dL — ABNORMAL HIGH (ref 70–99)
Glucose-Capillary: 179 mg/dL — ABNORMAL HIGH (ref 70–99)

## 2020-10-19 LAB — MAGNESIUM: Magnesium: 1.7 mg/dL (ref 1.7–2.4)

## 2020-10-19 LAB — RETICULOCYTES
Immature Retic Fract: 22.3 % — ABNORMAL HIGH (ref 2.3–15.9)
RBC.: 4.02 MIL/uL (ref 3.87–5.11)
Retic Count, Absolute: 49.4 10*3/uL (ref 19.0–186.0)
Retic Ct Pct: 1.2 % (ref 0.4–3.1)

## 2020-10-19 LAB — BASIC METABOLIC PANEL
Anion gap: 6 (ref 5–15)
BUN: 7 mg/dL (ref 6–20)
CO2: 22 mmol/L (ref 22–32)
Calcium: 9.1 mg/dL (ref 8.9–10.3)
Chloride: 108 mmol/L (ref 98–111)
Creatinine, Ser: 0.85 mg/dL (ref 0.44–1.00)
GFR, Estimated: >60 mL/min (ref >60)
Glucose, Bld: 183 mg/dL — ABNORMAL HIGH (ref 70–99)
Potassium: 3.7 mmol/L (ref 3.5–5.1)
Sodium: 136 mmol/L (ref 135–145)

## 2020-10-19 LAB — IRON AND TIBC
Iron: 58 ug/dL (ref 28–170)
Saturation Ratios: 14 % (ref 10.4–31.8)
TIBC: 422 ug/dL (ref 250–450)
UIBC: 364 ug/dL

## 2020-10-19 LAB — FOLATE: Folate: 9.7 ng/mL (ref >5.9)

## 2020-10-19 LAB — PHOSPHORUS: Phosphorus: 1.5 mg/dL — ABNORMAL LOW (ref 2.5–4.6)

## 2020-10-19 LAB — C-PEPTIDE: C-Peptide: 6.9 ng/mL — ABNORMAL HIGH (ref 1.1–4.4)

## 2020-10-19 LAB — VITAMIN B12: Vitamin B-12: 445 pg/mL (ref 180–914)

## 2020-10-19 MED ORDER — HEPARIN SODIUM (PORCINE) 5000 UNIT/ML IJ SOLN
5000.0000 [IU] | Freq: Three times a day (TID) | INTRAMUSCULAR | Status: DC
  Administered 2020-10-19 – 2020-10-20 (×2): 5000 [IU] via SUBCUTANEOUS
  Filled 2020-10-19 (×2): qty 1

## 2020-10-19 MED ORDER — METFORMIN HCL 500 MG PO TABS
1000.0000 mg | ORAL_TABLET | Freq: Two times a day (BID) | ORAL | Status: DC
  Administered 2020-10-19 – 2020-10-20 (×2): 1000 mg via ORAL
  Filled 2020-10-19 (×3): qty 2

## 2020-10-19 MED ORDER — POTASSIUM PHOSPHATES 15 MMOLE/5ML IV SOLN
20.0000 mmol | Freq: Once | INTRAVENOUS | Status: AC
  Administered 2020-10-19: 20 mmol via INTRAVENOUS
  Filled 2020-10-19: qty 6.67

## 2020-10-19 MED ORDER — CIPROFLOXACIN HCL 500 MG PO TABS
500.0000 mg | ORAL_TABLET | Freq: Two times a day (BID) | ORAL | Status: DC
  Administered 2020-10-19 – 2020-10-20 (×3): 500 mg via ORAL
  Filled 2020-10-19 (×3): qty 1

## 2020-10-19 MED ORDER — MAGNESIUM SULFATE 50 % IJ SOLN: 3.0000 g | Freq: Once | INTRAVENOUS | Status: DC

## 2020-10-19 MED ORDER — MAGNESIUM SULFATE 4 GM/100ML IV SOLN
4.0000 g | Freq: Once | INTRAVENOUS | Status: AC
  Administered 2020-10-19: 4 g via INTRAVENOUS
  Filled 2020-10-19: qty 100

## 2020-10-19 MED ORDER — COVID-19 MRNA VAC-TRIS(PFIZER) 30 MCG/0.3ML IM SUSP
0.3000 mL | Freq: Once | INTRAMUSCULAR | Status: AC
  Administered 2020-10-19: 0.3 mL via INTRAMUSCULAR
  Filled 2020-10-19: qty 0.3

## 2020-10-19 MED ORDER — ENSURE MAX PROTEIN PO LIQD
11.0000 [oz_av] | Freq: Every day | ORAL | Status: DC
  Administered 2020-10-19 – 2020-10-20 (×2): 11 [oz_av] via ORAL
  Filled 2020-10-19 (×2): qty 330

## 2020-10-19 NOTE — Progress Notes (Signed)
Patient referred by Benito Mccreedy, MD for syncope  Subjective:   Tracy Vaughn, female    DOB: June 14, 1976, 45 y.o.   MRN: [id number]   Chief Complaint  Patient presents with  . syncope and collapse     HPI  45 y.o. African American female with hypertension, type 2 DM, schizoaffective disorder, referred for syncope  Patient lives with her mother, and daughter. About a month ago, patient was playing with her 26 year old daughter, when she had sudden onset loss of consciousness. She denies any preceding chest pain, shortness of breath, palpitations, lightheadedness. No collateral history available today. She was unconscious for about about 5 min. When she woke up, she saw her daughter trying to wake her up. She did not lose bowel or bladder control. She has not had a recurrence of this symptom since. At baseline, she does not so any regular aerobic exercise and denies any chest pain, shortness of breath.   Past Medical History:  Diagnosis Date  . Diabetes mellitus without complication ([place])   . Hypertension   . Mental impairment   . Schizoaffective disorder ([place])      History reviewed. No pertinent surgical history.   Social History   Tobacco Use  Smoking Status Never Smoker  Smokeless Tobacco Never Used    Social History   Substance and Sexual Activity  Alcohol Use No     Family History  Problem Relation Age of Onset  . Heart disease Mother      Current Outpatient Medications on File Prior to Visit  Medication Sig Dispense Refill  . benztropine (COGENTIN) 2 MG tablet Take 1 tablet (2 mg total) by mouth at bedtime. 30 tablet 0  . cariprazine (VRAYLAR) capsule Take 1.5 mg by mouth daily.    . ciprofloxacin (CIPRO) 500 MG tablet Take 1 tablet (500 mg total) by mouth 2 (two) times daily. 6 tablet 0  . linagliptin (TRADJENTA) 5 MG TABS tablet Take 1 tablet (5 mg total) by mouth daily. 30 tablet 0  . lisinopril-hydrochlorothiazide (ZESTORETIC) 20-25 MG tablet  Take 1 tablet by mouth daily.    Marland Kitchen lithium carbonate 300 MG capsule Take 300-600 mg by mouth See admin instructions. Take 1 capsule in the morning and 2 capsule at bedtime.    . metFORMIN (GLUCOPHAGE) 1000 MG tablet Take 1,000 mg by mouth 2 (two) times daily.    Marland Kitchen omeprazole (PRILOSEC) 20 MG capsule Take 20 mg by mouth daily.    . rosuvastatin (CRESTOR) 40 MG tablet Take 40 mg by mouth at bedtime.    Marland Kitchen venlafaxine XR (EFFEXOR-XR) 75 MG 24 hr capsule Take 75 mg by mouth daily.     No current facility-administered medications on file prior to visit.    Cardiovascular and other pertinent studies:  EKG 10/22/2020: Sinus rhythm 77 bpm Early R wave transition. Consider RVH or old posterior infarct.   Recent labs: 10/03/2020: Glucose 92, BUN/Cr 6/0.77. EGFR 109. Na/K 137/4.2. Rest of the CMP normal H/H 11.2/37.5. MCV 79. Platelets 401 HbA1C 7.9% Lipid panel N/A TSH N/A   Review of Systems  Cardiovascular: Positive for syncope. Negative for chest pain, dyspnea on exertion, leg swelling and palpitations.         Vitals:   10/22/20 1112  BP: (!) 94/51  Pulse: 77  Temp: 98 F (36.7 C)     Body mass index is 45.35 kg/m. Filed Weights   10/22/20 1112  Weight: 281 lb (127.5 kg)     Objective:  Physical Exam Vitals and nursing note reviewed.  Constitutional:      General: She is not in acute distress.    Appearance: She is obese.  Neck:     Vascular: No JVD.  Cardiovascular:     Rate and Rhythm: Normal rate and regular rhythm.     Pulses: Normal pulses.     Heart sounds: Normal heart sounds. No murmur heard.   Pulmonary:     Effort: Pulmonary effort is normal.     Breath sounds: Normal breath sounds. No wheezing or rales.  Musculoskeletal:     Right lower leg: No edema.     Left lower leg: No edema.         Assessment & Recommendations:   44 y.o. African American female with hypertension, type 2 DM, schizoaffective disorder, referred for  syncope  Syncope: Etiology unclear. Risk factors for CAD include diabetes.  Recommend  Live cardiac telemetry, exercise nuclear stress test, echocardiogram. Reduced losartan-HCTZ to 1/2 tablet, given low blood pressure.  Further recommendations after above testing  Thank you for referring the patient to us. Please feel free to contact with any questions.    J , MD Pager: [phone] Office: [phone] 

## 2020-10-19 NOTE — Progress Notes (Signed)
Initial Nutrition Assessment  DOCUMENTATION CODES:   Morbid obesity  INTERVENTION:   -Ensure MAX Protein po BID, each supplement provides 150 kcal and 30 grams of protein  -Placed "Carbohydrate Counting" handout in discharge instructions -Recommend pt follow-up with outpatient dietitian for continued diet education  NUTRITION DIAGNOSIS:   Inadequate oral intake related to nausea,vomiting as evidenced by per patient/family report.  GOAL:   Patient will meet greater than or equal to 90% of their needs  MONITOR:   PO intake,Labs,Weight trends,I & O's  REASON FOR ASSESSMENT:   Consult Assessment of nutrition requirement/status,Diet education  ASSESSMENT:   45 y.o. female with history of diabetes mellitus type 2, hypertension, schizoaffective disorder has been experiencing diarrhea for the last week and a half with nausea vomiting.  Patient reports having diarrhea and N/V for the past week resulting in poor appetite and hypoglycemia. Pt also started a new diabetes medication.  Pt has had diet education in the past and followed by outpatient dietitian (last seen 2019).  Will place education materials in discharge instructions.   Per weight records, pt has lost 27 lbs since 03/29/2019. Insignificant for time frame. Will order Ensure Max given poor PO intakes lately.  Medications: K-Phos  Labs reviewed: CBGs: 199-313 Low Phos Mg WNL  NUTRITION - FOCUSED PHYSICAL EXAM:  Unable to complete  Diet Order:   Diet Order            Diet heart healthy/carb modified Room service appropriate? Yes; Fluid consistency: Thin  Diet effective now                 EDUCATION NEEDS:   Education needs have been addressed  Skin:  Skin Assessment: Reviewed RN Assessment  Last BM:  3/17  Height:   Ht Readings from Last 1 Encounters:  10/17/20 5\' 6"  (1.676 m)    Weight:   Wt Readings from Last 1 Encounters:  10/17/20 123.8 kg   BMI:  Body mass index is 44.05  kg/m.  Estimated Nutritional Needs:   Kcal:  1800-2000  Protein:  80-90g  Fluid:  2L/day   Clayton Bibles, MS, RD, LDN Inpatient Clinical Dietitian Contact information available via Amion

## 2020-10-19 NOTE — Progress Notes (Signed)
PROGRESS NOTE    Tracy Vaughn  EXB:[id number] DOB: 1976-03-26 DOA: 10/17/2020 PCP: Benito Mccreedy, MD     Brief Narrative:  Tracy Vaughn is a 45 y.o.BF PMHx schizoaffective DO, DM type 2, HTN,   Has been experiencing diarrhea for the last week and a half with nausea vomiting.  Patient states she was started on a new diabetic medication either glyburide or glipizide name of which is not clear about 2 weeks ago and few days later he started developing vomiting and diarrhea but no abdominal pain fever chills.  Denies any blood in the diarrhea.  Denies any recent sick contacts or any use of antibiotics or recent travel.  No abnormal food.  Patient had gone to her PCP and was referred to the ER because of low blood sugar and hypotension.  ED Course: In the ER patient was afebrile with CT scan of the abdomen pelvis showing features concerning for diarrheal state.  Covid test was negative.  Labs are significant for acute renal failure with creatinine 1.8 increased from 1.9 in April 2021.  Sodium was 128 potassium 2.6 magnesium 1.5 patient's blood sugar remained consistently low despite D50 starting on D5 and giving octreotide and had to be started on D10.  Patient had leukocytosis chest x-ray shows possibility of pneumonia but patient does not have any productive cough shortness of breath or chest pain.  Patient was started on fluids and admitted for hyperglycemia renal failure dehydration.   Subjective: Afebrile overnight A/O x4, patient stated DM has been controlled until started on combination of glipizide and glipizide.  Admits that she is not truly on a diabetic diet at home.   Assessment & Plan: Covid vaccination; vaccinated 1/3 [place]: Requests second vaccination (ordered)   Principal Problem:   Hypoglycemia associated with diabetes ([place]) Active Problems:   Pituitary microadenoma ([place])   Schizoaffective disorder ([facility])   Hypoglycemia   Hypokalemia   Nausea vomiting and diarrhea   ARF  (acute renal failure) (HCC)   Traveler's diarrhea   Acute gastroenteritis -Presented with nausea, vomiting or diarrhea for 7 to 10 days.  Incidentally symptoms started after taking sulfonylurea.  It is unclear at this time if his diarrhea is infectious versus noninfectious in origin.   -No fever, but elevated WBC count and lactic acid levels.  Lactic acid elevation could be due to dehydration as well. -Not on antibiotics.  Continue symptomatic management with IV fluid, antiemetics.  Recurrent hypoglycemia Type 2 diabetes mellitus -3/17 hemoglobin A1c= 7.6 -3/18 DM coordinator consult: Uncontrolled diabetic requiring some teaching.  Patient had been placed on sulfonylurea recently and had multiple hypoglycemic episodes requiring hospitalization -3/18 DM nutrition; patient admits that she is not truly eating a diabetic diet requires teaching -Restart Metformin 1000 mg BID -Will not restart sulfonylurea on this patient who has had multiple hypoglycemic episodes. -3/18 restarted her Metformin today will see where patient CBGs are with Metformin and a proper DM diet before adding additional medication    Lab Results  Component Value Date   GLUCAP 189 (H) 10/19/2020   GLUCAP 274 (H) 10/19/2020   GLUCAP 199 (H) 10/19/2020   GLUCAP 281 (H) 10/18/2020   GLUCAP 288 (H) 10/18/2020   Essential HTN -On admission episode of hypotension most likely again iatrogenic -Currently BP controlled without medication.  AKI  -On admission Cr 1.81 Lab Results  Component Value Date   CREATININE 0.85 10/19/2020   CREATININE 1.22 (H) 10/18/2020   CREATININE 1.25 (H) 10/18/2020   CREATININE 1.81 (H) 10/17/2020  CREATININE 0.91 12/01/2019  -Resolved back to baseline  Hypokalemia -On admission low secondary to diarrhea loss -Potassium goal> 4 -Resolved  Hypomagnesmia -See hypokalemia -Magnesium goal> 2 -3/18 magnesium IV 3 g   Chronic microcytic anemia -3/18 anemia panel pending -Occult blood  pending -Trend hemoglobin  Schizoaffective disorder  -Cogentin 2 mg daily -Cariprazine 1.5 mg daily -Lithium 300 mg every morning/600 mg every afternoon -Effexor Exar 75 mg daily  GERD -Prilosec 20 mg daily,  Hyperlipidemia -Crestor 40 mg at bedtime  Hypophosphatemia -Phosphorus goal> 2.5 -3/18 K-phos 20 mmol  Traveler's diarrhea -3/18 ciprofloxacin x3 days   DVT prophylaxis: Subcu heparin Code Status: Full Family Communication:  Status is: Inpatient    Dispo: The patient is from: Home              Anticipated d/c is to: Home              Anticipated d/c date is: 3/20              Patient currently unstable      Consultants:  DM coordinator  Procedures/Significant Events:    I have personally reviewed and interpreted all radiology studies and my findings are as above.  VENTILATOR SETTINGS:    Cultures   Antimicrobials:    Devices    LINES / TUBES:      Continuous Infusions: . magnesium sulfate bolus IVPB       Objective: Vitals:   10/19/20 1000 10/19/20 1200 10/19/20 1300 10/19/20 1400  BP:  131/89    Pulse: 96 80 93 (!) 103  Resp: (!) 21 (!) 22 (!) 27 (!) 24  Temp:  98.9 F (37.2 C)    TempSrc:  Oral    SpO2: 99% 100% 99% 100%  Weight:      Height:        Intake/Output Summary (Last 24 hours) at 10/19/2020 2017 Last data filed at 10/19/2020 0141 Gross per 24 hour  Intake 1871.91 ml  Output -  Net 1871.91 ml   Filed Weights   10/17/20 1652 10/17/20 2329  Weight: 123.4 kg 123.8 kg    Examination:  General: A/O x4 No acute respiratory distress Eyes: negative scleral hemorrhage, negative anisocoria, negative icterus ENT: Negative Runny nose, negative gingival bleeding, Neck:  Negative scars, masses, torticollis, lymphadenopathy, JVD Lungs: Clear to auscultation bilaterally without wheezes or crackles Cardiovascular: Regular rate and rhythm without murmur gallop or rub normal S1 and S2 Abdomen: negative abdominal  pain, nondistended, positive soft, bowel sounds, no rebound, no ascites, no appreciable mass Extremities: No significant cyanosis, clubbing, or edema bilateral lower extremities Skin: Negative rashes, lesions, ulcers Psychiatric:  Negative depression, negative anxiety, negative fatigue, negative mania  Central nervous system:  Cranial nerves II through XII intact, tongue/uvula midline, all extremities muscle strength 5/5, sensation intact throughout, negative dysarthria, negative expressive aphasia, negative receptive aphasia.  .     Data Reviewed: Care during the described time interval was provided by me .  I have reviewed this patient's available data, including medical history, events of note, physical examination, and all test results as part of my evaluation.  CBC: Recent Labs  Lab 10/17/20 1700 10/18/20 0034 10/18/20 0247 10/19/20 0258  WBC 17.5* 13.8* 12.8* 8.6  NEUTROABS 12.7*  --  8.0* 4.0  HGB 11.5* 11.0* 10.5* 9.8*  HCT 37.0 35.9* 33.8* 32.0*  MCV 78.4* 78.2* 77.7* 80.0  PLT 491* 392 387 268   Basic Metabolic Panel: Recent Labs  Lab 10/17/20 1700 10/17/20 1730  10/18/20 0034 10/18/20 0247 10/18/20 0715 10/19/20 0258  NA 128*  --   --  132*  --  136  K 2.6*  --   --  2.8*  --  3.7  CL 95*  --   --  102  --  108  CO2 21*  --   --  22  --  22  GLUCOSE 46*  --   --  127*  --  183*  BUN 20  --   --  12  --  7  CREATININE 1.81*  --  1.25* 1.22*  --  0.85  CALCIUM 8.3*  --   --  8.5*  --  9.1  MG  --  1.5*  --  1.9  --  1.7  PHOS  --   --   --   --  1.9* 1.5*   GFR: Estimated Creatinine Clearance: 113.5 mL/min (by C-G formula based on SCr of 0.85 mg/dL). Liver Function Tests: Recent Labs  Lab 10/17/20 1700 10/18/20 0247  AST 27 22  ALT 17 16  ALKPHOS 61 62  BILITOT 0.2* 0.3  PROT 8.8* 7.5  ALBUMIN 3.4* 3.1*   Recent Labs  Lab 10/17/20 1700  LIPASE 35   No results for input(s): AMMONIA in the last 168 hours. Coagulation Profile: No results for  input(s): INR, PROTIME in the last 168 hours. Cardiac Enzymes: No results for input(s): CKTOTAL, CKMB, CKMBINDEX, TROPONINI in the last 168 hours. BNP (last 3 results) No results for input(s): PROBNP in the last 8760 hours. HbA1C: Recent Labs    10/18/20 0034  HGBA1C 7.6*   CBG: Recent Labs  Lab 10/18/20 1645 10/18/20 2047 10/19/20 0830 10/19/20 1200 10/19/20 1628  GLUCAP 288* 281* 199* 274* 189*   Lipid Profile: No results for input(s): CHOL, HDL, LDLCALC, TRIG, CHOLHDL, LDLDIRECT in the last 72 hours. Thyroid Function Tests: No results for input(s): TSH, T4TOTAL, FREET4, T3FREE, THYROIDAB in the last 72 hours. Anemia Panel: Recent Labs    10/19/20 0258  VITAMINB12 445  FOLATE 9.7  FERRITIN 28  TIBC 422  IRON 58  RETICCTPCT 1.2   Sepsis Labs: Recent Labs  Lab 10/17/20 2226 10/18/20 0704 10/18/20 1121  PROCALCITON  --  <0.10  --   LATICACIDVEN 2.1* 3.1* 2.8*    Recent Results (from the past 240 hour(s))  Resp Panel by RT-PCR (Flu A&B, Covid) Nasopharyngeal Swab     Status: None   Collection Time: 10/17/20  5:22 PM   Specimen: Nasopharyngeal Swab; Nasopharyngeal(NP) swabs in vial transport medium  Result Value Ref Range Status   SARS Coronavirus 2 by RT PCR NEGATIVE NEGATIVE Final    Comment: (NOTE) SARS-CoV-2 target nucleic acids are NOT DETECTED.  The SARS-CoV-2 RNA is generally detectable in upper respiratory specimens during the acute phase of infection. The lowest concentration of SARS-CoV-2 viral copies this assay can detect is 138 copies/mL. A negative result does not preclude SARS-Cov-2 infection and should not be used as the sole basis for treatment or other patient management decisions. A negative result may occur with  improper specimen collection/handling, submission of specimen other than nasopharyngeal swab, presence of viral mutation(s) within the areas targeted by this assay, and inadequate number of viral copies(<138 copies/mL). A  negative result must be combined with clinical observations, patient history, and epidemiological information. The expected result is Negative.  Fact Sheet for Patients:  EntrepreneurPulse.com.au  Fact Sheet for Healthcare Providers:  IncredibleEmployment.be  This test is no t yet approved or  cleared by the [place] and  has been authorized for detection and/or diagnosis of SARS-CoV-2 by FDA under an Emergency Use Authorization (EUA). This EUA will remain  in effect (meaning this test can be used) for the duration of the COVID-19 declaration under Section 564(b)(1) of the Act, 21 U.S.C.section 360bbb-3(b)(1), unless the authorization is terminated  or revoked sooner.       Influenza A by PCR NEGATIVE NEGATIVE Final   Influenza B by PCR NEGATIVE NEGATIVE Final    Comment: (NOTE) The Xpert Xpress SARS-CoV-2/FLU/RSV plus assay is intended as an aid in the diagnosis of influenza from Nasopharyngeal swab specimens and should not be used as a sole basis for treatment. Nasal washings and aspirates are unacceptable for Xpert Xpress SARS-CoV-2/FLU/RSV testing.  Fact Sheet for Patients: EntrepreneurPulse.com.au  Fact Sheet for Healthcare Providers: IncredibleEmployment.be  This test is not yet approved or cleared by the [place] FDA and has been authorized for detection and/or diagnosis of SARS-CoV-2 by FDA under an Emergency Use Authorization (EUA). This EUA will remain in effect (meaning this test can be used) for the duration of the COVID-19 declaration under Section 564(b)(1) of the Act, 21 U.S.C. section 360bbb-3(b)(1), unless the authorization is terminated or revoked.  Performed at [facility], [address] [place], [place] [address]   Gastrointestinal Panel by PCR , Stool     Status: Abnormal   Collection Time: 10/17/20  8:36 PM   Specimen: Stool  Result Value Ref Range  Status   Campylobacter species NOT DETECTED NOT DETECTED Final   Plesimonas shigelloides NOT DETECTED NOT DETECTED Final   Salmonella species NOT DETECTED NOT DETECTED Final   Yersinia enterocolitica NOT DETECTED NOT DETECTED Final   Vibrio species NOT DETECTED NOT DETECTED Final   Vibrio cholerae NOT DETECTED NOT DETECTED Final   Enteroaggregative E coli (EAEC) DETECTED (A) NOT DETECTED Final    Comment: RESULT CALLED TO, READ BACK BY AND VERIFIED WITH: SIERRA MOORE AT 1552 ON 10/18/20 BY SS    Enteropathogenic E coli (EPEC) DETECTED (A) NOT DETECTED Final    Comment: RESULT CALLED TO, READ BACK BY AND VERIFIED WITH: SIERRA MOORE AT 1552 ON 10/18/20 BY SS    Enterotoxigenic E coli (ETEC) NOT DETECTED NOT DETECTED Final   Shiga like toxin producing E coli (STEC) NOT DETECTED NOT DETECTED Final   Shigella/Enteroinvasive E coli (EIEC) NOT DETECTED NOT DETECTED Final   Cryptosporidium NOT DETECTED NOT DETECTED Final   Cyclospora cayetanensis NOT DETECTED NOT DETECTED Final   Entamoeba histolytica NOT DETECTED NOT DETECTED Final   Giardia lamblia NOT DETECTED NOT DETECTED Final   Adenovirus F40/41 NOT DETECTED NOT DETECTED Final   Astrovirus NOT DETECTED NOT DETECTED Final   Norovirus GI/GII NOT DETECTED NOT DETECTED Final   Rotavirus A NOT DETECTED NOT DETECTED Final   Sapovirus (I, II, IV, and V) NOT DETECTED NOT DETECTED Final    Comment: Performed at [facility] [address] [place], [place] [address]  C Difficile Quick Screen w PCR reflex     Status: None   Collection Time: 10/17/20  8:36 PM   Specimen: Stool  Result Value Ref Range Status   C Diff antigen NEGATIVE NEGATIVE Final   C Diff toxin NEGATIVE NEGATIVE Final   C Diff interpretation No C. difficile detected.  Final    Comment: Performed at [facility], [place] [address], [place], [place] [address]  Radiology Studies: No results found.      Scheduled Meds: . benztropine  2 mg Oral QHS   . cariprazine  1.5 mg Oral Daily  . Chlorhexidine Gluconate Cloth  6 each Topical Daily  . ciprofloxacin  500 mg Oral BID  . insulin aspart  0-5 Units Subcutaneous QHS  . insulin aspart  0-9 Units Subcutaneous TID WC  . lithium carbonate  300 mg Oral Q breakfast   And  . lithium carbonate  600 mg Oral Q supper  . metFORMIN  1,000 mg Oral BID WC  . pantoprazole  40 mg Oral Daily  . Ensure Max Protein  11 oz Oral Daily  . rosuvastatin  40 mg Oral QHS  . venlafaxine XR  75 mg Oral Daily   Continuous Infusions: . magnesium sulfate bolus IVPB       LOS: 2 days    Time spent:40 min    WOODS, Geraldo Docker, MD [facility]   If 7PM-7AM, please contact night-coverage 10/19/2020, 8:17 PM

## 2020-10-19 NOTE — Discharge Instructions (Signed)

## 2020-10-20 DIAGNOSIS — K529 Noninfective gastroenteritis and colitis, unspecified: Secondary | ICD-10-CM

## 2020-10-20 DIAGNOSIS — I10 Essential (primary) hypertension: Secondary | ICD-10-CM

## 2020-10-20 DIAGNOSIS — E118 Type 2 diabetes mellitus with unspecified complications: Secondary | ICD-10-CM

## 2020-10-20 DIAGNOSIS — N179 Acute kidney failure, unspecified: Secondary | ICD-10-CM | POA: Diagnosis not present

## 2020-10-20 DIAGNOSIS — E162 Hypoglycemia, unspecified: Secondary | ICD-10-CM | POA: Diagnosis not present

## 2020-10-20 DIAGNOSIS — E1165 Type 2 diabetes mellitus with hyperglycemia: Secondary | ICD-10-CM

## 2020-10-20 DIAGNOSIS — E11649 Type 2 diabetes mellitus with hypoglycemia without coma: Secondary | ICD-10-CM | POA: Diagnosis not present

## 2020-10-20 LAB — COMPREHENSIVE METABOLIC PANEL
ALT: 15 U/L (ref 0–44)
AST: 15 U/L (ref 15–41)
Albumin: 3 g/dL — ABNORMAL LOW (ref 3.5–5.0)
Alkaline Phosphatase: 57 U/L (ref 38–126)
Anion gap: 7 (ref 5–15)
BUN: 10 mg/dL (ref 6–20)
CO2: 24 mmol/L (ref 22–32)
Calcium: 9.3 mg/dL (ref 8.9–10.3)
Chloride: 105 mmol/L (ref 98–111)
Creatinine, Ser: 0.79 mg/dL (ref 0.44–1.00)
GFR, Estimated: >60 mL/min (ref >60)
Glucose, Bld: 202 mg/dL — ABNORMAL HIGH (ref 70–99)
Potassium: 3.8 mmol/L (ref 3.5–5.1)
Sodium: 136 mmol/L (ref 135–145)
Total Bilirubin: 0.5 mg/dL (ref 0.3–1.2)
Total Protein: 7.4 g/dL (ref 6.5–8.1)

## 2020-10-20 LAB — CBC WITH DIFFERENTIAL/PLATELET
Abs Immature Granulocytes: 0.12 10*3/uL — ABNORMAL HIGH (ref 0.00–0.07)
Basophils Absolute: 0 10*3/uL (ref 0.0–0.1)
Basophils Relative: 0 %
Eosinophils Absolute: 0.2 10*3/uL (ref 0.0–0.5)
Eosinophils Relative: 2 %
HCT: 32.6 % — ABNORMAL LOW (ref 36.0–46.0)
Hemoglobin: 9.7 g/dL — ABNORMAL LOW (ref 12.0–15.0)
Immature Granulocytes: 1 %
Lymphocytes Relative: 32 %
Lymphs Abs: 3.4 10*3/uL (ref 0.7–4.0)
MCH: 23.8 pg — ABNORMAL LOW (ref 26.0–34.0)
MCHC: 29.8 g/dL — ABNORMAL LOW (ref 30.0–36.0)
MCV: 79.9 fL — ABNORMAL LOW (ref 80.0–100.0)
Monocytes Absolute: 1 10*3/uL (ref 0.1–1.0)
Monocytes Relative: 9 %
Neutro Abs: 5.9 10*3/uL (ref 1.7–7.7)
Neutrophils Relative %: 56 %
Platelets: 398 10*3/uL (ref 150–400)
RBC: 4.08 MIL/uL (ref 3.87–5.11)
RDW: 16.4 % — ABNORMAL HIGH (ref 11.5–15.5)
WBC: 10.6 10*3/uL — ABNORMAL HIGH (ref 4.0–10.5)
nRBC: 0 % (ref 0.0–0.2)

## 2020-10-20 LAB — MAGNESIUM: Magnesium: 1.8 mg/dL (ref 1.7–2.4)

## 2020-10-20 LAB — GLUCOSE, CAPILLARY: Glucose-Capillary: 198 mg/dL — ABNORMAL HIGH (ref 70–99)

## 2020-10-20 LAB — PHOSPHORUS: Phosphorus: 2.4 mg/dL — ABNORMAL LOW (ref 2.5–4.6)

## 2020-10-20 MED ORDER — CIPROFLOXACIN HCL 500 MG PO TABS: 500.0000 mg | ORAL_TABLET | Freq: Two times a day (BID) | ORAL | 0 refills | Status: DC

## 2020-10-20 MED ORDER — LINAGLIPTIN 5 MG PO TABS
5.0000 mg | ORAL_TABLET | Freq: Every day | ORAL | Status: DC
  Administered 2020-10-20: 5 mg via ORAL
  Filled 2020-10-20: qty 1

## 2020-10-20 MED ORDER — LINAGLIPTIN 5 MG PO TABS: 5.0000 mg | ORAL_TABLET | Freq: Every day | ORAL | 0 refills | Status: AC

## 2020-10-20 NOTE — Discharge Summary (Addendum)
Physician Discharge Summary  Tracy Vaughn KNL:[id number] DOB: [birth year] DOA: 10/17/2020  PCP: Tracy Mccreedy, MD  Admit date: 10/17/2020 Discharge date: 10/20/2020  Time spent: 30 minutes  Recommendations for Outpatient Follow-up:   Covid vaccination; vaccinated 1/3 [place]: Requests second vaccination (ordered)   Acute gastroenteritis -Presented with nausea, vomiting or diarrhea for 7 to 10 days. Incidentally symptoms started after taking sulfonylurea.  -No fever, but elevated WBC count and lactic acid levels. Lactic acid elevation could be due to dehydration as well. -Resolved; see traveler's diarrhea  Recurrent hypoglycemia Type 2 diabetes mellitus -3/17 hemoglobin A1c= 7.6 -3/18 DM coordinator consult: Uncontrolled diabetic requiring some teaching.  Patient had been placed on sulfonylurea recently and had multiple hypoglycemic episodes requiring hospitalization -3/18 DM nutrition; patient admits that she is not truly eating a diabetic diet requires teaching -Restart Metformin 1000 mg BID -Will not restart sulfonylurea on this patient who has had multiple hypoglycemic episodes. -3/18 restarted her Metformin today will see where patient CBGs are with Metformin and a proper DM diet before adding additional medication.  Still running slightly high. -Start Tradjenta 5 mg daily Lab Results  Component Value Date   GLUCAP 198 (H) 10/20/2020   GLUCAP 179 (H) 10/19/2020   GLUCAP 189 (H) 10/19/2020   GLUCAP 274 (H) 10/19/2020   GLUCAP 199 (H) 10/19/2020   Essential HTN -On admission episode of hypotension most likely again iatrogenic -Currently BP controlled without medication. -PCP to determine if/when to restart BP medication  AKI -On admission Cr 1.81 Lab Results  Component Value Date   CREATININE 0.79 10/20/2020   CREATININE 0.85 10/19/2020   CREATININE 1.22 (H) 10/18/2020   CREATININE 1.25 (H) 10/18/2020   CREATININE 1.81 (H) 10/17/2020  -Resolved  Hypokalemia -On  admission low secondary to diarrhea loss -Potassium goal> 4 -Resolved  Hypomagnesmia -See hypokalemia -Magnesium goal> 2 -Resolved  Chronic microcytic anemia -3/18 anemia panel pending Lab Results  Component Value Date   HGB 9.7 (L) 10/20/2020   HGB 9.8 (L) 10/19/2020   HGB 10.5 (L) 10/18/2020   HGB 11.0 (L) 10/18/2020   HGB 11.5 (L) 10/17/2020  -Stable  Schizoaffective disorder -Cogentin 2 mg daily -Cariprazine 1.5 mg daily -Lithium 300 mg every morning/600 mg every afternoon -Effexor Exar 75 mg daily  GERD -Prilosec 20 mg daily,  Hyperlipidemia -Crestor 40 mg at bedtime  Hypophosphatemia -Phosphorus goal> 2.5  Traveler's diarrhea -3/18 ciprofloxacin x3 days     Discharge Diagnoses:  Principal Problem:   Hypoglycemia associated with diabetes ([place]) Active Problems:   Pituitary microadenoma ([facility])   Schizoaffective disorder ([facility])   Hypoglycemia   Hypokalemia   Nausea vomiting and diarrhea   ARF (acute renal failure) ([place])   Traveler's diarrhea   Discharge Condition: Stable  Diet recommendation: Heart healthy/carb modified  Filed Weights   10/17/20 1652 10/17/20 2329  Weight: 123.4 kg 123.8 kg    History of present illness:  Tracy Vaughn a 45 y.o.BF PMHx schizoaffective DO, DM type 2, HTN,   Has been experiencing diarrhea for the last week and a half with nausea vomiting. Patient states she was started on a new diabetic medication either glyburide or glipizide name of which is not clear about 2 weeks ago and few days later he started developing vomiting and diarrhea but no abdominal pain fever chills. Denies any blood in the diarrhea. Denies any recent sick contacts or any use of antibiotics or recent travel. No abnormal food. Patient had gone to her PCP and was referred to the ER because  of low blood sugar and hypotension.  ED Course:In the ER patient was afebrile with CT scan of the abdomen pelvis showing features concerning for  diarrheal state. Covid test was negative. Labs are significant for acute renal failure with creatinine 1.8 increased from 1.9 in April 2021. Sodium was 128 potassium 2.6 magnesium 1.5 patient's blood sugar remained consistently low despite D50 starting on D5 and giving octreotide and had to be started on D10. Patient had leukocytosis chest x-ray shows possibility of pneumonia but patient does not have any productive cough shortness of breath or chest pain. Patient was started on fluids and admitted for hyperglycemia renal failure dehydration.   Hospital Course:  See above   Consultations: DM coordinator  Cultures   3/16 SARS coronavirus negative 3/16 influenza A/B negative 3/16 GI panel positive EAEC/EP EC   Antibiotics Anti-infectives (From admission, onward)   Start     Ordered Stop   10/19/20 1000  ciprofloxacin (CIPRO) tablet 500 mg        10/19/20 0939 10/22/20 0759       Discharge Exam: Vitals:   10/19/20 1400 10/19/20 2141 10/19/20 2300 10/20/20 0420  BP:  117/74  139/88  Pulse: (!) 103 91  96  Resp: (!) 24 18  18   Temp:  98.5 F (36.9 C)  98.3 F (36.8 C)  TempSrc:  Oral  Oral  SpO2: 100% 99% 100% 98%  Weight:      Height:        General: A/O x4 No acute respiratory distress Eyes: negative scleral hemorrhage, negative anisocoria, negative icterus ENT: Negative Runny nose, negative gingival bleeding, Neck:  Negative scars, masses, torticollis, lymphadenopathy, JVD Lungs: Clear to auscultation bilaterally without wheezes or crackles Cardiovascular: Regular rate and rhythm without murmur gallop or rub normal S1 and S2  Discharge Instructions   Allergies as of 10/20/2020      Reactions   Fanapt [iloperidone]    Unknown, but takes the cogentin to fix the side effects   Procaine Rash   bumps Other reaction(s): hives, itching      Medication List    STOP taking these medications   glimepiride 4 MG tablet Commonly known as: AMARYL   glipiZIDE 10  MG tablet Commonly known as: GLUCOTROL   lisinopril-hydrochlorothiazide 20-25 MG tablet Commonly known as: ZESTORETIC   propranolol 20 MG tablet Commonly known as: INDERAL     TAKE these medications   benztropine 2 MG tablet Commonly known as: COGENTIN Take 1 tablet (2 mg total) by mouth at bedtime.   cariprazine capsule Commonly known as: VRAYLAR Take 1.5 mg by mouth daily.   ciprofloxacin 500 MG tablet Commonly known as: CIPRO Take 1 tablet (500 mg total) by mouth 2 (two) times daily.   linagliptin 5 MG Tabs tablet Commonly known as: TRADJENTA Take 1 tablet (5 mg total) by mouth daily.   lithium carbonate 300 MG capsule Take 300-600 mg by mouth See admin instructions. Take 1 capsule in the morning and 2 capsule at bedtime.   metFORMIN 1000 MG tablet Commonly known as: GLUCOPHAGE Take 1,000 mg by mouth 2 (two) times daily.   omeprazole 20 MG capsule Commonly known as: PRILOSEC Take 20 mg by mouth daily.   rosuvastatin 40 MG tablet Commonly known as: CRESTOR Take 40 mg by mouth at bedtime.   venlafaxine XR 75 MG 24 hr capsule Commonly known as: EFFEXOR-XR Take 75 mg by mouth daily.      Allergies  Allergen Reactions  . Fanapt [Iloperidone]  Unknown, but takes the cogentin to fix the side effects  . Procaine Rash    bumps Other reaction(s): hives, itching      The results of significant diagnostics from this hospitalization (including imaging, microbiology, ancillary and laboratory) are listed below for reference.    Significant Diagnostic Studies: CT Abdomen Pelvis W Contrast  Result Date: 10/17/2020 CLINICAL DATA:  LEFT lower quadrant pain and diarrhea for 1 week. EXAM: CT ABDOMEN AND PELVIS WITH CONTRAST TECHNIQUE: Multidetector CT imaging of the abdomen and pelvis was performed using the standard protocol following bolus administration of intravenous contrast. CONTRAST:  39mL OMNIPAQUE IOHEXOL 300 MG/ML  SOLN COMPARISON:  None FINDINGS: Lower  chest: Incidental imaging of the lung bases without effusion or sign of consolidation. Hepatobiliary: No focal, suspicious hepatic lesion. Tiny hypodensity in the RIGHT hepatic lobe approximately 6 mm too small for definitive characterization but likely benign. Portal vein is patent. No pericholecystic stranding. No biliary duct distension. Pancreas: Normal, without mass, inflammation or ductal dilatation. Spleen: Normal size and contour without focal lesion. Adrenals/Urinary Tract: Adrenal glands are normal. Symmetric renal enhancement. No hydronephrosis. No suspicious renal lesion. Smooth contour the urinary bladder. Stomach/Bowel: No acute gastrointestinal process related to stomach or small bowel. The appendix is normal. Liquid stool is present throughout the colon. No pericolonic stranding or mural stratification of the colon. No sign of bowel obstruction. Vascular/Lymphatic: Patent abdominal vasculature. Normal caliber abdominal aorta. Smooth contour the IVC. There is no gastrohepatic or hepatoduodenal ligament lymphadenopathy. No retroperitoneal or mesenteric lymphadenopathy. No pelvic sidewall lymphadenopathy. Reproductive: Uterus and adnexa unremarkable by CT. Other: No ascites.  No signs of free air. Musculoskeletal: No acute musculoskeletal process. No destructive bone finding. IMPRESSION: 1. Liquid stool is present throughout the colon. No pericolonic stranding or mural stratification of the colon. Findings are nonspecific but can be seen in the setting of diarrheal illness. No overt signs of colitis though given patient history consider correlation for any risk factors for C difficile with further evaluation as warranted. 2. Normal appendix. Electronically Signed   By: Zetta Bills M.D.   On: 10/17/2020 19:45    Microbiology: Recent Results (from the past 240 hour(s))  Resp Panel by RT-PCR (Flu A&B, Covid) Nasopharyngeal Swab     Status: None   Collection Time: 10/17/20  5:22 PM   Specimen:  Nasopharyngeal Swab; Nasopharyngeal(NP) swabs in vial transport medium  Result Value Ref Range Status   SARS Coronavirus 2 by RT PCR NEGATIVE NEGATIVE Final    Comment: (NOTE) SARS-CoV-2 target nucleic acids are NOT DETECTED.  The SARS-CoV-2 RNA is generally detectable in upper respiratory specimens during the acute phase of infection. The lowest concentration of SARS-CoV-2 viral copies this assay can detect is 138 copies/mL. A negative result does not preclude SARS-Cov-2 infection and should not be used as the sole basis for treatment or other patient management decisions. A negative result may occur with  improper specimen collection/handling, submission of specimen other than nasopharyngeal swab, presence of viral mutation(s) within the areas targeted by this assay, and inadequate number of viral copies(<138 copies/mL). A negative result must be combined with clinical observations, patient history, and epidemiological information. The expected result is Negative.  Fact Sheet for Patients:  EntrepreneurPulse.com.au  Fact Sheet for Healthcare Providers:  IncredibleEmployment.be  This test is no t yet approved or cleared by the [place] FDA and  has been authorized for detection and/or diagnosis of SARS-CoV-2 by FDA under an Emergency Use Authorization (EUA). This EUA will remain  in effect (meaning this test can be used) for the duration of the COVID-19 declaration under Section 564(b)(1) of the Act, 21 U.S.C.section 360bbb-3(b)(1), unless the authorization is terminated  or revoked sooner.       Influenza A by PCR NEGATIVE NEGATIVE Final   Influenza B by PCR NEGATIVE NEGATIVE Final    Comment: (NOTE) The Xpert Xpress SARS-CoV-2/FLU/RSV plus assay is intended as an aid in the diagnosis of influenza from Nasopharyngeal swab specimens and should not be used as a sole basis for treatment. Nasal washings and aspirates are unacceptable for  Xpert Xpress SARS-CoV-2/FLU/RSV testing.  Fact Sheet for Patients: EntrepreneurPulse.com.au  Fact Sheet for Healthcare Providers: IncredibleEmployment.be  This test is not yet approved or cleared by the [place] FDA and has been authorized for detection and/or diagnosis of SARS-CoV-2 by FDA under an Emergency Use Authorization (EUA). This EUA will remain in effect (meaning this test can be used) for the duration of the COVID-19 declaration under Section 564(b)(1) of the Act, 21 U.S.C. section 360bbb-3(b)(1), unless the authorization is terminated or revoked.  Performed at [facility], [address] [place], [place] [address]   Gastrointestinal Panel by PCR , Stool     Status: Abnormal   Collection Time: 10/17/20  8:36 PM   Specimen: Stool  Result Value Ref Range Status   Campylobacter species NOT DETECTED NOT DETECTED Final   Plesimonas shigelloides NOT DETECTED NOT DETECTED Final   Salmonella species NOT DETECTED NOT DETECTED Final   Yersinia enterocolitica NOT DETECTED NOT DETECTED Final   Vibrio species NOT DETECTED NOT DETECTED Final   Vibrio cholerae NOT DETECTED NOT DETECTED Final   Enteroaggregative E coli (EAEC) DETECTED (A) NOT DETECTED Final    Comment: RESULT CALLED TO, READ BACK BY AND VERIFIED WITH: SIERRA MOORE AT 1552 ON 10/18/20 BY SS    Enteropathogenic E coli (EPEC) DETECTED (A) NOT DETECTED Final    Comment: RESULT CALLED TO, READ BACK BY AND VERIFIED WITH: SIERRA MOORE AT 1552 ON 10/18/20 BY SS    Enterotoxigenic E coli (ETEC) NOT DETECTED NOT DETECTED Final   Shiga like toxin producing E coli (STEC) NOT DETECTED NOT DETECTED Final   Shigella/Enteroinvasive E coli (EIEC) NOT DETECTED NOT DETECTED Final   Cryptosporidium NOT DETECTED NOT DETECTED Final   Cyclospora cayetanensis NOT DETECTED NOT DETECTED Final   Entamoeba histolytica NOT DETECTED NOT DETECTED Final   Giardia lamblia NOT DETECTED NOT  DETECTED Final   Adenovirus F40/41 NOT DETECTED NOT DETECTED Final   Astrovirus NOT DETECTED NOT DETECTED Final   Norovirus GI/GII NOT DETECTED NOT DETECTED Final   Rotavirus A NOT DETECTED NOT DETECTED Final   Sapovirus (I, II, IV, and V) NOT DETECTED NOT DETECTED Final    Comment: Performed at [facility], [address] [place], [place] [address]  C Difficile Quick Screen w PCR reflex     Status: None   Collection Time: 10/17/20  8:36 PM   Specimen: Stool  Result Value Ref Range Status   C Diff antigen NEGATIVE NEGATIVE Final   C Diff toxin NEGATIVE NEGATIVE Final   C Diff interpretation No C. difficile detected.  Final    Comment: Performed at [facility], [place] [address] [place],  [address]     Labs: Basic Metabolic Panel: Recent Labs  Lab 10/17/20 1700 10/17/20 1730 10/18/20 0034 10/18/20 0247 10/18/20 0715 10/19/20 0258 10/20/20 0550  NA 128*  --   --  132*  --  136 136  K 2.6*  --   --  2.8*  --  3.7 3.8  CL 95*  --   --  102  --  108 105  CO2 21*  --   --  22  --  22 24  GLUCOSE 46*  --   --  127*  --  183* 202*  BUN 20  --   --  12  --  7 10  CREATININE 1.81*  --  1.25* 1.22*  --  0.85 0.79  CALCIUM 8.3*  --   --  8.5*  --  9.1 9.3  MG  --  1.5*  --  1.9  --  1.7 1.8  PHOS  --   --   --   --  1.9* 1.5* 2.4*   Liver Function Tests: Recent Labs  Lab 10/17/20 1700 10/18/20 0247 10/20/20 0550  AST 27 22 15   ALT 17 16 15   ALKPHOS 61 62 57  BILITOT 0.2* 0.3 0.5  PROT 8.8* 7.5 7.4  ALBUMIN 3.4* 3.1* 3.0*   Recent Labs  Lab 10/17/20 1700  LIPASE 35   No results for input(s): AMMONIA in the last 168 hours. CBC: Recent Labs  Lab 10/17/20 1700 10/18/20 0034 10/18/20 0247 10/19/20 0258 10/20/20 0550  WBC 17.5* 13.8* 12.8* 8.6 10.6*  NEUTROABS 12.7*  --  8.0* 4.0 5.9  HGB 11.5* 11.0* 10.5* 9.8* 9.7*  HCT 37.0 35.9* 33.8* 32.0* 32.6*  MCV 78.4* 78.2* 77.7* 80.0 79.9*  PLT 491* 392 387 353 398   Cardiac Enzymes: No results  for input(s): CKTOTAL, CKMB, CKMBINDEX, TROPONINI in the last 168 hours. BNP: BNP (last 3 results) No results for input(s): BNP in the last 8760 hours.  ProBNP (last 3 results) No results for input(s): PROBNP in the last 8760 hours.  CBG: Recent Labs  Lab 10/19/20 0830 10/19/20 1200 10/19/20 1628 10/19/20 2143 10/20/20 0745  GLUCAP 199* 274* 189* 179* 198*       Signed:  Dia Crawford, MD [facility]

## 2020-10-21 DIAGNOSIS — R55 Syncope and collapse: Secondary | ICD-10-CM | POA: Insufficient documentation

## 2020-10-22 ENCOUNTER — Other Ambulatory Visit: Payer: Self-pay

## 2020-10-22 ENCOUNTER — Inpatient Hospital Stay: Payer: Medicaid Other

## 2020-10-22 ENCOUNTER — Encounter: Payer: Self-pay | Admitting: Cardiology

## 2020-10-22 ENCOUNTER — Ambulatory Visit: Payer: Medicaid Other | Admitting: Cardiology

## 2020-10-22 VITALS — BP 105/75 | HR 95 | Temp 98.0°F | Ht 66.0 in | Wt 281.0 lb

## 2020-10-22 DIAGNOSIS — R55 Syncope and collapse: Secondary | ICD-10-CM

## 2020-11-05 ENCOUNTER — Ambulatory Visit: Payer: Medicaid Other

## 2020-11-05 ENCOUNTER — Other Ambulatory Visit: Payer: Self-pay

## 2020-11-05 DIAGNOSIS — R55 Syncope and collapse: Secondary | ICD-10-CM

## 2020-11-07 ENCOUNTER — Other Ambulatory Visit: Payer: Self-pay

## 2020-11-07 ENCOUNTER — Ambulatory Visit: Payer: Medicaid Other

## 2020-11-07 DIAGNOSIS — R55 Syncope and collapse: Secondary | ICD-10-CM

## 2020-11-18 NOTE — Progress Notes (Signed)
Patient referred by Benito Mccreedy, MD for syncope  Subjective:   Tracy Vaughn, female    DOB: 08-26-1975, 45 y.o.   MRN: [id number]   Chief Complaint  Patient presents with  . Loss of Consciousness  . Follow-up  . Results     HPI  45 y.o. African American female with hypertension, type 2 DM, schizoaffective disorder, referred for syncope  Reviewed recent test results with the patient, details below. Patient ha snot had any further syncopal episodes.   Initial consultation HPI 10/2019: Patient lives with her mother, and daughter. About a month ago, patient was playing with her 44 year old daughter, when she had sudden onset loss of consciousness. She denies any preceding chest pain, shortness of breath, palpitations, lightheadedness. No collateral history available today. She was unconscious for about about 5 min. When she woke up, she saw her daughter trying to wake her up. She did not lose bowel or bladder control. She has not had a recurrence of this symptom since. At baseline, she does not so any regular aerobic exercise and denies any chest pain, shortness of breath.   Current Outpatient Medications on File Prior to Visit  Medication Sig Dispense Refill  . benztropine (COGENTIN) 2 MG tablet Take 1 tablet (2 mg total) by mouth at bedtime. 30 tablet 0  . cariprazine (VRAYLAR) capsule Take 1.5 mg by mouth daily.    Marland Kitchen linagliptin (TRADJENTA) 5 MG TABS tablet Take 1 tablet (5 mg total) by mouth daily. 30 tablet 0  . lisinopril-hydrochlorothiazide (ZESTORETIC) 20-25 MG tablet Take 1 tablet by mouth daily.    Marland Kitchen lithium carbonate 300 MG capsule Take 300-600 mg by mouth See admin instructions. Take 1 capsule in the morning and 2 capsule at bedtime.    . metFORMIN (GLUCOPHAGE) 1000 MG tablet Take 1,000 mg by mouth 2 (two) times daily.    Marland Kitchen omeprazole (PRILOSEC) 20 MG capsule Take 20 mg by mouth daily.    . rosuvastatin (CRESTOR) 40 MG tablet Take 40 mg by mouth at bedtime.    Marland Kitchen  venlafaxine XR (EFFEXOR-XR) 75 MG 24 hr capsule Take 75 mg by mouth daily.     No current facility-administered medications on file prior to visit.    Cardiovascular and other pertinent studies:  Mobile cardiac telemetry 14 days 10/22/2020 - 11/05/2020: Dominant rhythm: Sinus. HR 46-165 bpm. Avg HR 85 bpm. <1% isolated SVE, couplets. <1% isolated VE, no couplet/triplets. No atrial fibrillation/atrial flutter/SVT/VT/high grade AV block, sinus pause >3sec noted. 3 patient triggered events, correlate with sinus rhythm/artifact.   Exercise Myoview stress test 11/07/2020: Exercise nuclear stress test was performed using Bruce protocol. Patient reached 4.6 METS, and 90% of age predicted maximum heart rate. Exercise capacity was very low. Chest pain not reported. Heart rate and hemodynamic response were normal. Stress EKG revealed low ischemic changes. Normal myocardial perfusion. Stress LVEF 56% Low risk study.   Echocardiogram 11/05/2020:  Left ventricle cavity is normal in size and wall thickness. Normal global  wall motion. Normal LV systolic function with EF 67%. Normal diastolic  filling pattern.  Mild tricuspid regurgitation. Estimated pulmonary artery systolic pressure  29 mmHg.  EKG 10/22/2020: Sinus rhythm 77 bpm Early R wave transition. Consider RVH or old posterior infarct.   Recent labs: 10/03/2020: Glucose 92, BUN/Cr 6/0.77. EGFR 109. Na/K 137/4.2. Rest of the CMP normal H/H 11.2/37.5. MCV 79. Platelets 401 HbA1C 7.9% Lipid panel N/A TSH N/A   Review of Systems  Cardiovascular: Positive for syncope. Negative  for chest pain, dyspnea on exertion, leg swelling and palpitations.         Vitals:   11/19/20 0952  BP: 132/83  Pulse: 96  Temp: 98 F (36.7 C)  SpO2: 99%     Body mass index is 45.35 kg/m. Filed Weights   11/19/20 0952  Weight: 281 lb (127.5 kg)     Objective:   Physical Exam Vitals and nursing note reviewed.  Constitutional:      General: She  is not in acute distress.    Appearance: She is obese.  Neck:     Vascular: No JVD.  Cardiovascular:     Rate and Rhythm: Normal rate and regular rhythm.     Pulses: Normal pulses.     Heart sounds: Normal heart sounds. No murmur heard.   Pulmonary:     Effort: Pulmonary effort is normal.     Breath sounds: Normal breath sounds. No wheezing or rales.  Musculoskeletal:     Right lower leg: No edema.     Left lower leg: No edema.         Assessment & Recommendations:   46 y.o. African American female with hypertension, type 2 DM, schizoaffective disorder, referred for syncope  Syncope: Etiology unclear. Risk factors for CAD include diabetes.  Workup with echocardiogram, stress test, cardiac telemetry was unremarkable.  Continue f/u w/PCP. If she has recurrence of syncope, will consider loop recorder.  F/u prn w/me.   Nigel Mormon, MD Pager: [phone] Office: [phone]

## 2020-11-19 ENCOUNTER — Ambulatory Visit: Payer: Medicaid Other | Admitting: Cardiology

## 2020-11-19 ENCOUNTER — Encounter: Payer: Self-pay | Admitting: Cardiology

## 2020-11-19 ENCOUNTER — Other Ambulatory Visit: Payer: Self-pay

## 2020-11-19 VITALS — BP 132/83 | HR 96 | Temp 98.0°F | Ht 66.0 in | Wt 281.0 lb

## 2020-11-19 DIAGNOSIS — R55 Syncope and collapse: Secondary | ICD-10-CM

## 2021-01-05 ENCOUNTER — Emergency Department (HOSPITAL_COMMUNITY)
Admission: EM | Admit: 2021-01-05 | Discharge: 2021-01-05 | Disposition: A | Discharge status: Home / Self Care | Payer: Medicaid Other | Attending: Emergency Medicine | Admitting: Emergency Medicine

## 2021-01-05 DIAGNOSIS — T50905A Adverse effect of unspecified drugs, medicaments and biological substances, initial encounter: Secondary | ICD-10-CM

## 2021-01-05 DIAGNOSIS — E119 Type 2 diabetes mellitus without complications: Secondary | ICD-10-CM | POA: Insufficient documentation

## 2021-01-05 DIAGNOSIS — Z8616 Personal history of COVID-19: Secondary | ICD-10-CM | POA: Insufficient documentation

## 2021-01-05 DIAGNOSIS — R251 Tremor, unspecified: Secondary | ICD-10-CM | POA: Insufficient documentation

## 2021-01-05 DIAGNOSIS — I10 Essential (primary) hypertension: Secondary | ICD-10-CM | POA: Insufficient documentation

## 2021-01-05 DIAGNOSIS — Z7984 Long term (current) use of oral hypoglycemic drugs: Secondary | ICD-10-CM | POA: Insufficient documentation

## 2021-01-05 DIAGNOSIS — H579 Unspecified disorder of eye and adnexa: Secondary | ICD-10-CM | POA: Diagnosis present

## 2021-01-05 DIAGNOSIS — Z79899 Other long term (current) drug therapy: Secondary | ICD-10-CM | POA: Diagnosis not present

## 2021-01-05 MED ORDER — DIPHENHYDRAMINE HCL 50 MG/ML IJ SOLN
25.0000 mg | Freq: Once | INTRAMUSCULAR | Status: AC
  Administered 2021-01-05: 25 mg via INTRAVENOUS
  Filled 2021-01-05: qty 1

## 2021-01-05 NOTE — ED Provider Notes (Signed)
[place] DEPT Provider Note   CSN: [id number] Arrival date & time: 01/05/21  1805     History Chief Complaint  Patient presents with  . Eye Problem    Tracy Vaughn is a 45 y.o. female.  44 year old female with history of bipolar disorder as well as mental impairment presents with trouble with upper gaze x1 year.  Patient recently taken off her lithium about 7 days ago feels like her symptoms are worse.  States that the symptoms come and go and is unsure of what makes it better or worse.  Denies any associated headache.  Does have a slight resting tremor.  Patient also had been on Bicitra pain and that was switched to a different medication.  Called EMS and was transported here        Past Medical History:  Diagnosis Date  . Diabetes mellitus without complication ([place])   . Hypertension   . Mental impairment   . Schizoaffective disorder [facility])     Patient Active Problem List   Diagnosis Date Noted  . Syncope and collapse 10/21/2020  . 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 Hypoglycemia associated with diabetes ([place]) 10/17/2020  . Hypokalemia 10/17/2020  . Nausea vomiting and diarrhea 10/17/2020  . ARF (acute renal failure) ([place]) 10/17/2020  . Schizoaffective disorder ([place]) 11/28/2019  . Hypoglycemia 11/28/2019  . COVID-19 virus infection 11/28/2019  . Hyperprolactinemia ([place]) 12/08/2017  . Pituitary microadenoma ([place]) 12/08/2017  . Type 2 diabetes mellitus with complication, without long-term current use of insulin ([place]) 07/30/2017    No past surgical history on file.   OB History   No obstetric history on file.     Family History  Problem Relation Age of Onset  . Heart disease Mother     Social History   Tobacco Use  . Smoking status: Never Smoker  . Smokeless tobacco: Never Used  Vaping Use  . Vaping Use: Never used  Substance Use Topics  . Alcohol use: No  . Drug use: Never    Home Medications Prior to Admission  medications   Medication Sig Start Date End Date Taking? Authorizing Provider  benztropine (COGENTIN) 2 MG tablet Take 1 tablet (2 mg total) by mouth at bedtime. 10/14/18   Daleen Bo, MD  cariprazine (VRAYLAR) capsule Take 1.5 mg by mouth daily.    [provider]  linagliptin (TRADJENTA) 5 MG TABS tablet Take 1 tablet (5 mg total) by mouth daily. 10/20/20   Allie Bossier, MD  lisinopril-hydrochlorothiazide (ZESTORETIC) 20-25 MG tablet Take 1 tablet by mouth daily.    [provider]  lithium carbonate 300 MG capsule Take 300-600 mg by mouth See admin instructions. Take 1 capsule in the morning and 2 capsule at bedtime.    [provider]  metFORMIN (GLUCOPHAGE) 1000 MG tablet Take 1,000 mg by mouth 2 (two) times daily. 10/17/19   [provider]  omeprazole (PRILOSEC) 20 MG capsule Take 20 mg by mouth daily.    [provider]  rosuvastatin (CRESTOR) 40 MG tablet Take 40 mg by mouth at bedtime. 08/31/20   [provider]  venlafaxine XR (EFFEXOR-XR) 75 MG 24 hr capsule Take 75 mg by mouth daily.    [provider]    Allergies    Fanapt [iloperidone] and Procaine  Review of Systems   Review of Systems  Unable to perform ROS: Psychiatric disorder    Physical Exam Updated Vital Signs BP (!) 163/99 (BP Location: Left Arm)   Pulse  85   Temp 98.4 F (36.9 C) (Oral)   Resp 16   SpO2 95%   Physical Exam Vitals and nursing note reviewed.  Constitutional:      General: She is not in acute distress.    Appearance: Normal appearance. She is well-developed. She is not toxic-appearing.  HENT:     Head: Normocephalic and atraumatic.  Eyes:     General: Lids are normal.     Conjunctiva/sclera: Conjunctivae normal.     Pupils: Pupils are equal, round, and reactive to light.  Neck:     Thyroid: No thyroid mass.     Trachea: No tracheal deviation.  Cardiovascular:     Rate and Rhythm: Normal rate and regular rhythm.      Heart sounds: Normal heart sounds. No murmur heard. No gallop.   Pulmonary:     Effort: Pulmonary effort is normal. No respiratory distress.     Breath sounds: Normal breath sounds. No stridor. No decreased breath sounds, wheezing, rhonchi or rales.  Abdominal:     General: Bowel sounds are normal. There is no distension.     Palpations: Abdomen is soft.     Tenderness: There is no abdominal tenderness. There is no rebound.  Musculoskeletal:        General: No tenderness. Normal range of motion.     Cervical back: Normal range of motion and neck supple.  Skin:    General: Skin is warm and dry.     Findings: No abrasion or rash.  Neurological:     Mental Status: She is alert and oriented to person, place, and time.     GCS: GCS eye subscore is 4. GCS verbal subscore is 5. GCS motor subscore is 6.     Cranial Nerves: No cranial nerve deficit.     Sensory: No sensory deficit.  Psychiatric:        Attention and Perception: Attention normal.        Mood and Affect: Affect is flat.        Speech: Speech is delayed.        Behavior: Behavior is slowed and withdrawn.     ED Results / Procedures / Treatments   Labs (all labs ordered are listed, but only abnormal results are displayed) Labs Reviewed - No data to display  EKG None  Radiology No results found.  Procedures Procedures   Medications Ordered in ED Medications  diphenhydrAMINE (BENADRYL) injection 25 mg (has no administration in time range)    ED Course  I have reviewed the triage vital signs and the nursing notes.  Pertinent labs & imaging results that were available during my care of the patient were reviewed by me and considered in my medical decision making (see chart for details).    MDM Rules/Calculators/A&P                          Patient able to ambulate in the room without difficulty.  Patient was able to track me in the room and moved around.  She then at times will have upward gaze.  Suspect EPS and  will give Benadryl and reevaluate  7:34 PM Patient given Benadryl and feels much better at this time.  Will discharge home Final Clinical Impression(s) / ED Diagnoses Final diagnoses:  None    Rx / DC Orders ED Discharge Orders    None       Lacretia Leigh, MD 01/05/21 1934

## 2021-01-05 NOTE — Discharge Instructions (Addendum)
Use Benadryl as directed if you have any issues with your eyes.  Follow-up with your doctor next week

## 2021-01-05 NOTE — ED Triage Notes (Signed)
Patient bib [facility] for upward gaze x2 months. Patient says there is pain of 9/10. Patient is on lithium , being treated for tardive dysken. Tremors at baseline. In triage patients eyes are gazed upward, patient will make eye contact/answer questions and then turns eyes back upwards.

## 2021-01-30 ENCOUNTER — Encounter: Payer: Self-pay | Admitting: Neurology

## 2021-02-21 ENCOUNTER — Ambulatory Visit: Payer: Medicaid Other | Admitting: Neurology

## 2021-03-08 ENCOUNTER — Ambulatory Visit (INDEPENDENT_AMBULATORY_CARE_PROVIDER_SITE_OTHER): Payer: Medicaid Other

## 2021-03-08 ENCOUNTER — Ambulatory Visit (INDEPENDENT_AMBULATORY_CARE_PROVIDER_SITE_OTHER): Payer: Medicaid Other | Admitting: Podiatry

## 2021-03-08 ENCOUNTER — Other Ambulatory Visit: Payer: Self-pay

## 2021-03-08 ENCOUNTER — Encounter: Payer: Self-pay | Admitting: Podiatry

## 2021-03-08 DIAGNOSIS — M7751 Other enthesopathy of right foot: Secondary | ICD-10-CM | POA: Diagnosis not present

## 2021-03-08 DIAGNOSIS — M79671 Pain in right foot: Secondary | ICD-10-CM | POA: Diagnosis not present

## 2021-03-08 DIAGNOSIS — M7752 Other enthesopathy of left foot: Secondary | ICD-10-CM | POA: Diagnosis not present

## 2021-03-08 DIAGNOSIS — M778 Other enthesopathies, not elsewhere classified: Secondary | ICD-10-CM

## 2021-03-08 MED ORDER — TRIAMCINOLONE ACETONIDE 10 MG/ML IJ SUSP: 20.0000 mg | Freq: Once | INTRAMUSCULAR | Status: AC

## 2021-03-08 NOTE — Progress Notes (Signed)
Subjective:   Patient ID: Tracy Vaughn, female   DOB: 45 y.o.   MRN: [id number]   HPI Patient presents with intense discomfort on both dorsum of her feet long-term diabetic who is not in control recently but has been in better control over the last few years.  Patient's not been seen for an extensive period of time   Review of Systems  All other systems reviewed and are negative.      Objective:  Physical Exam Vitals and nursing note reviewed.  Constitutional:      Appearance: She is well-developed.  Pulmonary:     Effort: Pulmonary effort is normal.  Musculoskeletal:        General: Normal range of motion.  Skin:    General: Skin is warm.  Neurological:     Mental Status: She is alert.    Neurovascular status intact muscle strength adequate range of motion adequate with exquisite discomfort on the dorsal aspect of the extensor complex bilateral with no indications of current neuropathic condition for vascular condition.  Good digital perfusion well oriented x3     Assessment:  Ability that this is extensor tendinitis bilateral with inflammatory component versus other inflammatory condition H&P     Plan:  We reviewed condition and x-ray today I did sterile prep injected the extensor complex bilateral 3 mg Kenalog 5 mg Xylocaine advised on wider shoes discussed neuropathy discussed diabetic foot exams to be done on a daily basis and reviewed with her x-rays  X-rays indicate there may be some mild arthritis of the midtarsal joint bilateral nothing severe with no other pathology noted

## 2021-04-25 IMAGING — CT CT ABD-PELV W/ CM
2 of 3 series · 12 of 36 positions shown, 18 images · IV contrast (Omnipaque)
Comparison: None

CLINICAL DATA: LEFT lower quadrant pain and diarrhea for 1 week.

EXAM:
CT ABDOMEN AND PELVIS WITH CONTRAST
TECHNIQUE: Multidetector CT imaging of the abdomen and pelvis was performed
using the standard protocol following bolus administration of
intravenous contrast.
CONTRAST:  80mL OMNIPAQUE IOHEXOL 300 MG/ML  SOLN

[Series 6: sagittal st · sagittal · 0.79mm/px · 1 of 155 slices shown, 2 images]
[im 52/155  soft-tissue]
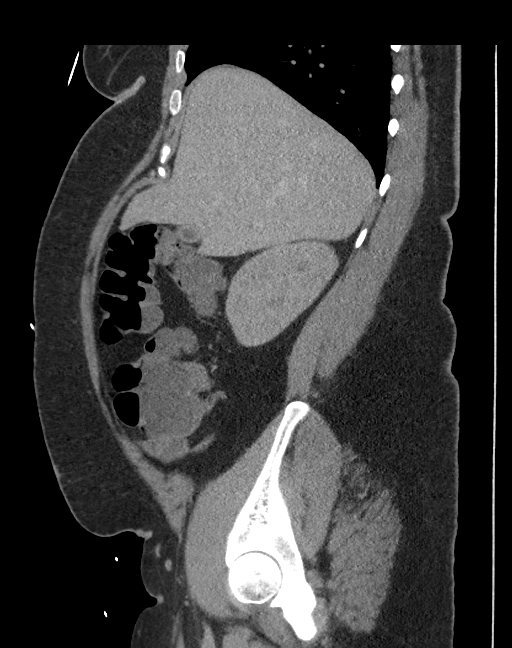
[im 52/155  bone]
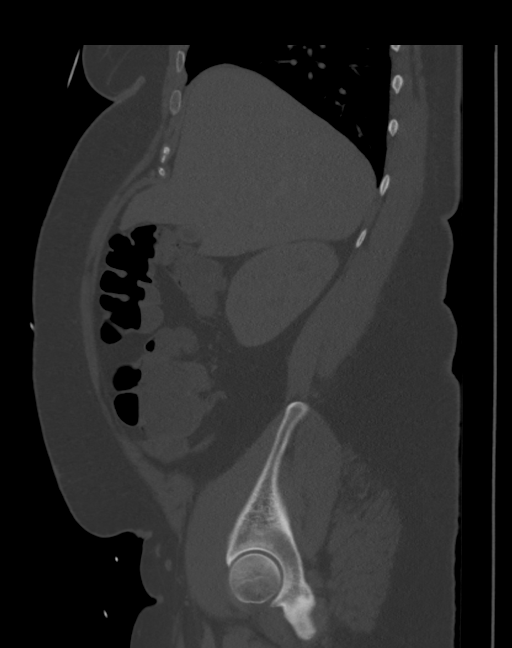

[Series 7: delays · axial · 0.85mm/px · z∈[-226,-106]mm · 11 of 28 slices shown, 16 images]
[im 2/28  soft-tissue]
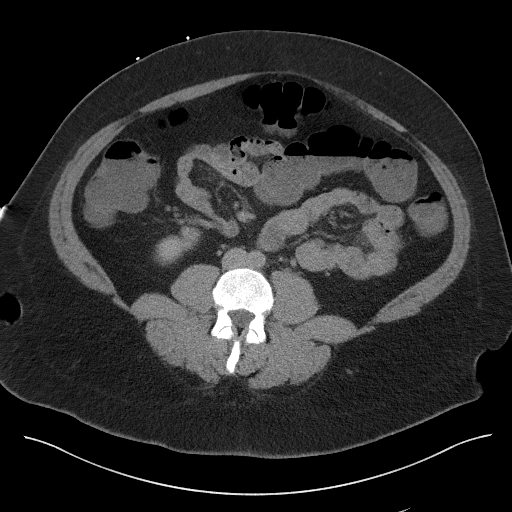
[im 2/28  bone]
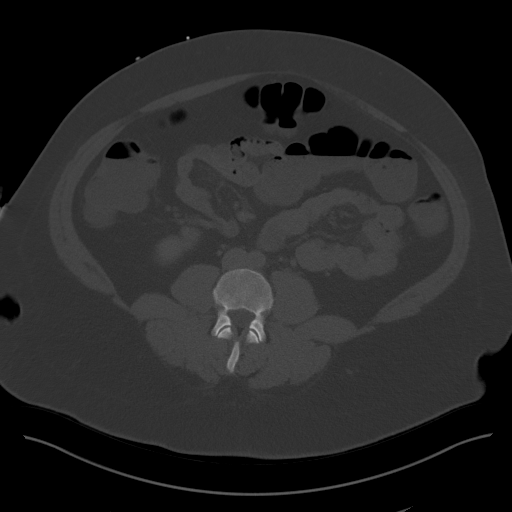
[im 6/28  soft-tissue]
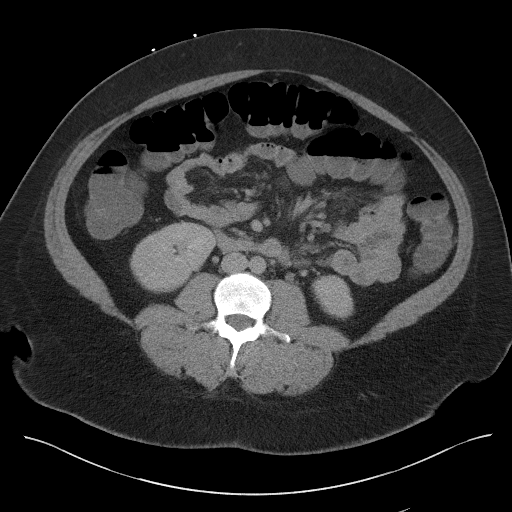
[im 8/28  soft-tissue]
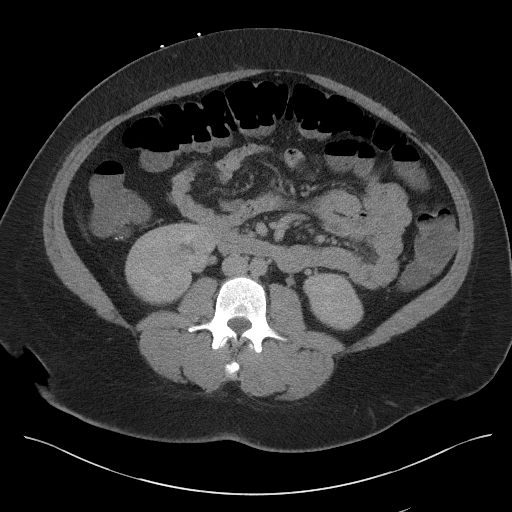
[im 10/28  soft-tissue]
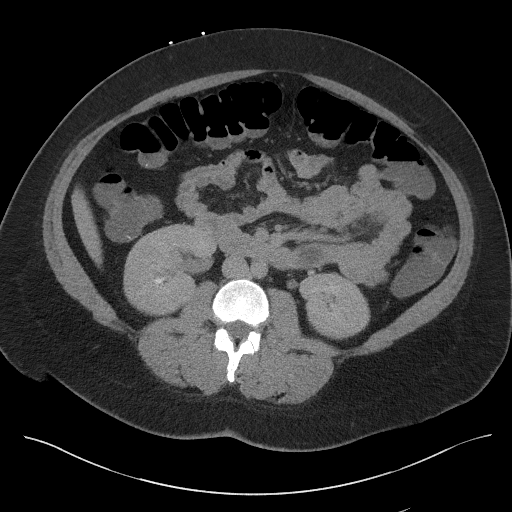
[im 13/28  soft-tissue]
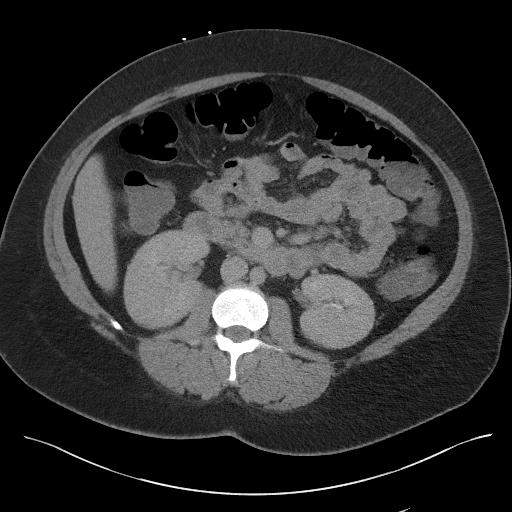
[im 15/28  soft-tissue]
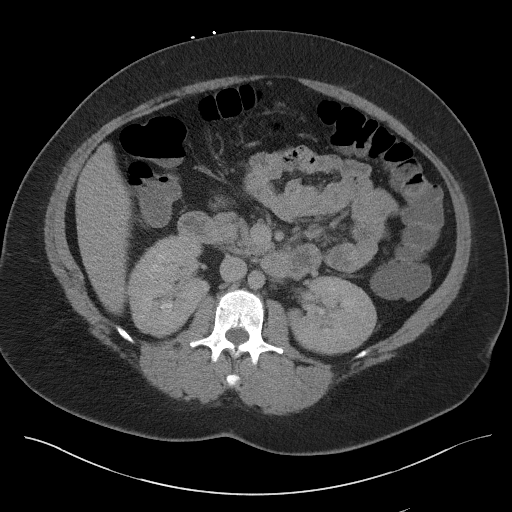
[im 19/28  soft-tissue]
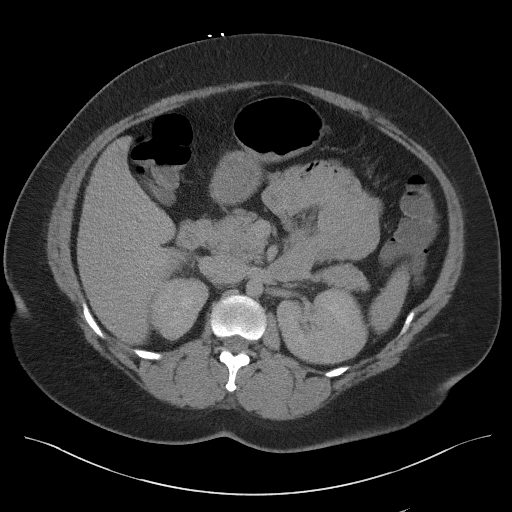
[im 20/28  soft-tissue]
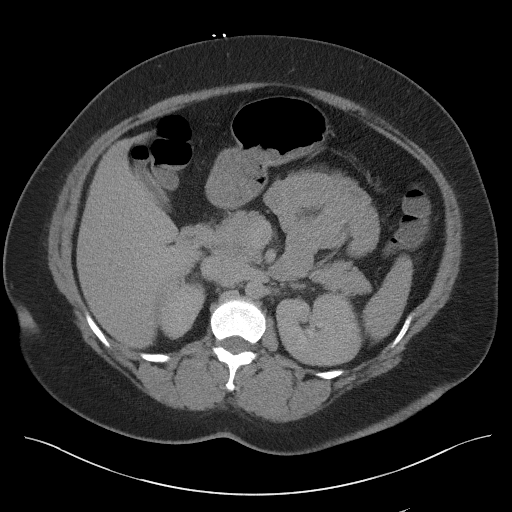
[im 20/28  lung]
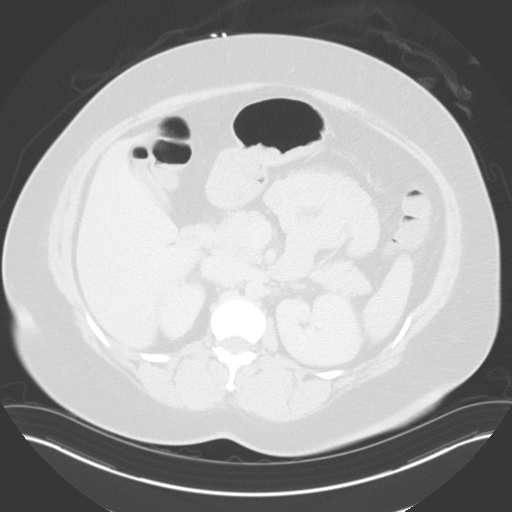
[im 22/28  soft-tissue]
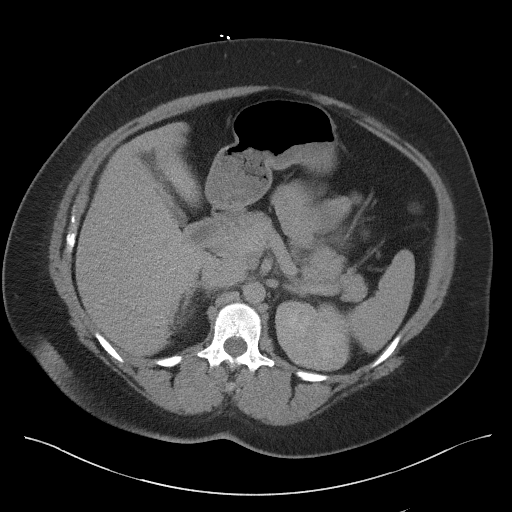
[im 22/28  lung]
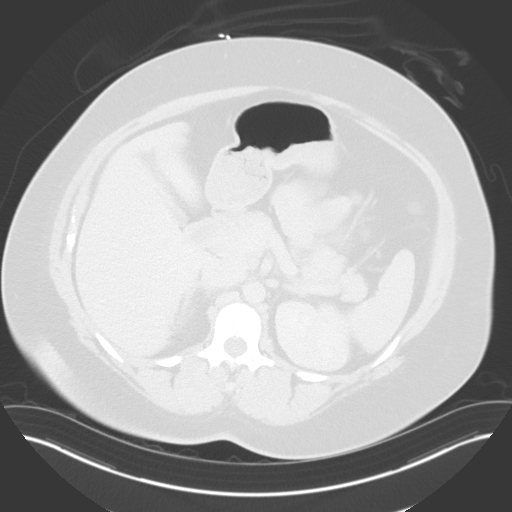
[im 22/28  bone]
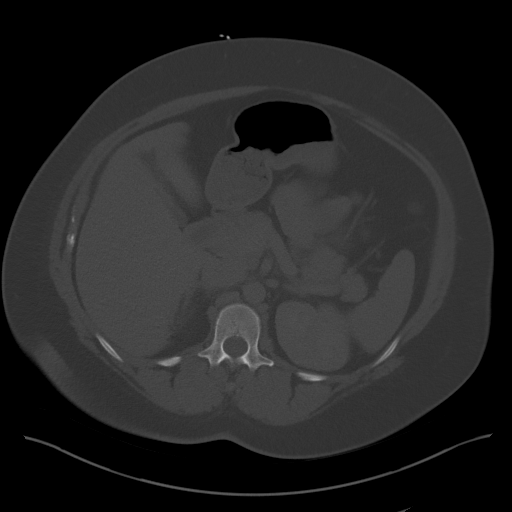
[im 24/28  lung]
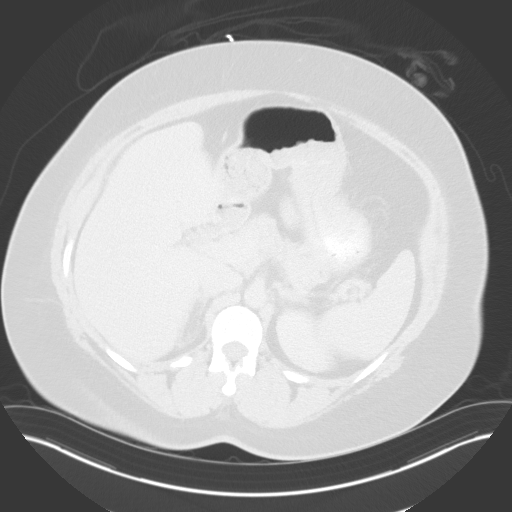
[im 26/28  soft-tissue]
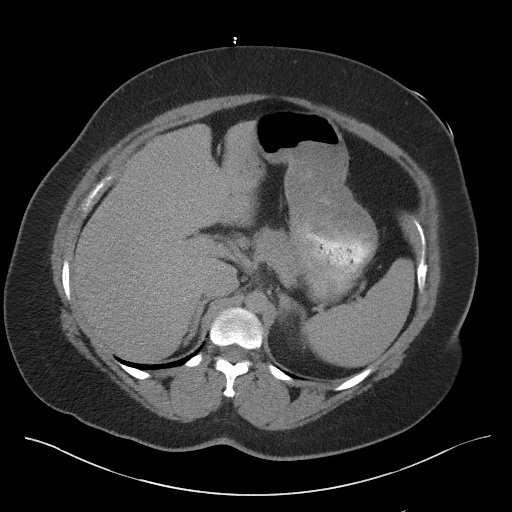
[im 26/28  lung]
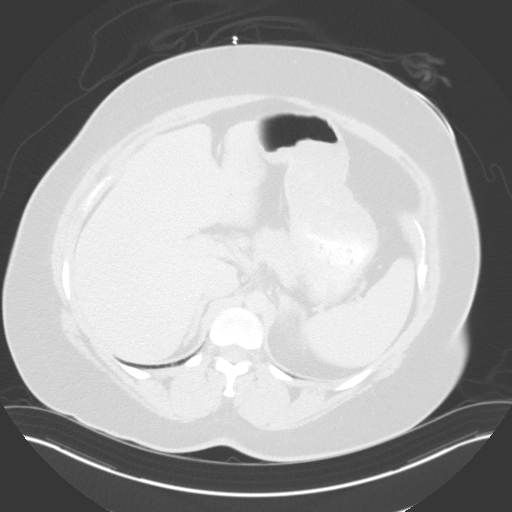

[12 of 36 positions shown; findings below may reference images not displayed]

FINDINGS: Lower chest: Incidental imaging of the lung bases without effusion
or sign of consolidation.

Hepatobiliary: No focal, suspicious hepatic lesion. Tiny hypodensity
in the RIGHT hepatic lobe approximately 6 mm too small for
definitive characterization but likely benign. Portal vein is
patent. No pericholecystic stranding. No biliary duct distension.

Pancreas: Normal, without mass, inflammation or ductal dilatation.

Spleen: Normal size and contour without focal lesion.

Adrenals/Urinary Tract: Adrenal glands are normal.

Symmetric renal enhancement. No hydronephrosis. No suspicious renal
lesion.

Smooth contour the urinary bladder.

Stomach/Bowel: No acute gastrointestinal process related to stomach
or small bowel. The appendix is normal.

Liquid stool is present throughout the colon. No pericolonic
stranding or mural stratification of the colon. No sign of bowel
obstruction.

Vascular/Lymphatic: Patent abdominal vasculature. Normal caliber
abdominal aorta. Smooth contour the IVC.

There is no gastrohepatic or hepatoduodenal ligament
lymphadenopathy. No retroperitoneal or mesenteric lymphadenopathy.

No pelvic sidewall lymphadenopathy.

Reproductive: Uterus and adnexa unremarkable by CT.

Other: No ascites.  No signs of free air.

Musculoskeletal: No acute musculoskeletal process. No destructive
bone finding.
IMPRESSION: 1. Liquid stool is present throughout the colon. No pericolonic
stranding or mural stratification of the colon. Findings are
nonspecific but can be seen in the setting of diarrheal illness. No
overt signs of colitis though given patient history consider
correlation for any risk factors for C difficile with further
evaluation as warranted.
2. Normal appendix.

## 2021-05-06 ENCOUNTER — Encounter: Payer: Self-pay | Admitting: Podiatry

## 2023-03-19 ENCOUNTER — Other Ambulatory Visit: Payer: Self-pay | Admitting: Family Medicine

## 2023-03-19 DIAGNOSIS — Z1231 Encounter for screening mammogram for malignant neoplasm of breast: Secondary | ICD-10-CM

## 2023-04-10 ENCOUNTER — Ambulatory Visit
Admission: RE | Admit: 2023-04-10 | Discharge: 2023-04-10 | Disposition: A | Discharge status: Home / Self Care | Payer: MEDICAID | Source: Ambulatory Visit | Attending: Family Medicine | Admitting: Family Medicine

## 2023-04-10 DIAGNOSIS — Z1231 Encounter for screening mammogram for malignant neoplasm of breast: Secondary | ICD-10-CM

## 2023-04-15 ENCOUNTER — Other Ambulatory Visit: Payer: Self-pay | Admitting: Family Medicine

## 2023-04-15 DIAGNOSIS — R928 Other abnormal and inconclusive findings on diagnostic imaging of breast: Secondary | ICD-10-CM

## 2023-04-29 ENCOUNTER — Other Ambulatory Visit: Payer: Self-pay | Admitting: Family Medicine

## 2023-04-29 ENCOUNTER — Ambulatory Visit
Admission: RE | Admit: 2023-04-29 | Discharge: 2023-04-29 | Disposition: A | Discharge status: Home / Self Care | Payer: MEDICAID | Source: Ambulatory Visit | Attending: Family Medicine | Admitting: Family Medicine

## 2023-04-29 DIAGNOSIS — R928 Other abnormal and inconclusive findings on diagnostic imaging of breast: Secondary | ICD-10-CM

## 2023-04-29 DIAGNOSIS — N6320 Unspecified lump in the left breast, unspecified quadrant: Secondary | ICD-10-CM

## 2023-05-05 ENCOUNTER — Ambulatory Visit
Admission: RE | Admit: 2023-05-05 | Discharge: 2023-05-05 | Disposition: A | Discharge status: Home / Self Care | Payer: MEDICAID | Source: Ambulatory Visit | Attending: Family Medicine | Admitting: Family Medicine

## 2023-05-05 ENCOUNTER — Other Ambulatory Visit: Payer: Self-pay | Admitting: Family Medicine

## 2023-05-05 DIAGNOSIS — N6320 Unspecified lump in the left breast, unspecified quadrant: Secondary | ICD-10-CM

## 2023-05-05 DIAGNOSIS — R928 Other abnormal and inconclusive findings on diagnostic imaging of breast: Secondary | ICD-10-CM

## 2023-05-05 HISTORY — PX: BREAST BIOPSY: SHX20

## 2023-05-06 LAB — SURGICAL PATHOLOGY

## 2023-05-11 ENCOUNTER — Encounter: Payer: Self-pay | Admitting: Family Medicine

## 2024-07-11 ENCOUNTER — Encounter: Payer: Self-pay | Admitting: Family Medicine

## 2024-07-11 DIAGNOSIS — Z1231 Encounter for screening mammogram for malignant neoplasm of breast: Secondary | ICD-10-CM

## 2024-07-21 ENCOUNTER — Other Ambulatory Visit: Payer: Self-pay | Admitting: Family Medicine

## 2024-07-21 DIAGNOSIS — Z1231 Encounter for screening mammogram for malignant neoplasm of breast: Secondary | ICD-10-CM

## 2024-08-19 ENCOUNTER — Ambulatory Visit
Admission: RE | Admit: 2024-08-19 | Discharge: 2024-08-19 | Disposition: A | Discharge status: Home / Self Care | Payer: MEDICAID | Source: Ambulatory Visit | Attending: Family Medicine | Admitting: Family Medicine

## 2024-08-19 DIAGNOSIS — Z1231 Encounter for screening mammogram for malignant neoplasm of breast: Secondary | ICD-10-CM

## 2024-08-24 ENCOUNTER — Other Ambulatory Visit: Payer: Self-pay | Admitting: Family Medicine

## 2024-08-24 DIAGNOSIS — R928 Other abnormal and inconclusive findings on diagnostic imaging of breast: Secondary | ICD-10-CM

## 2024-08-31 ENCOUNTER — Ambulatory Visit
Admission: RE | Admit: 2024-08-31 | Discharge: 2024-08-31 | Disposition: A | Payer: MEDICAID | Source: Ambulatory Visit | Attending: Family Medicine | Admitting: Family Medicine

## 2024-08-31 DIAGNOSIS — R928 Other abnormal and inconclusive findings on diagnostic imaging of breast: Secondary | ICD-10-CM
